# Patient Record
Sex: Male | Born: 1950 | Race: Black or African American | Hispanic: No | Marital: Married | Smoking: Never smoker
Health system: Southern US, Community
[De-identification: ages and names within clinical notes are randomized; demographics above are authoritative.]

## PROBLEM LIST (undated history)

## (undated) DIAGNOSIS — R399 Unspecified symptoms and signs involving the genitourinary system: Secondary | ICD-10-CM

## (undated) DIAGNOSIS — G629 Polyneuropathy, unspecified: Secondary | ICD-10-CM

## (undated) DIAGNOSIS — K219 Gastro-esophageal reflux disease without esophagitis: Secondary | ICD-10-CM

## (undated) DIAGNOSIS — Z973 Presence of spectacles and contact lenses: Secondary | ICD-10-CM

## (undated) DIAGNOSIS — Z860101 Personal history of adenomatous and serrated colon polyps: Secondary | ICD-10-CM

## (undated) DIAGNOSIS — Z86010 Personal history of colonic polyps: Secondary | ICD-10-CM

## (undated) DIAGNOSIS — R7303 Prediabetes: Secondary | ICD-10-CM

## (undated) DIAGNOSIS — M1990 Unspecified osteoarthritis, unspecified site: Secondary | ICD-10-CM

## (undated) HISTORY — PX: TONSILLECTOMY: SUR1361

## (undated) HISTORY — PX: COLONOSCOPY: SHX174

---

## 2003-05-08 ENCOUNTER — Ambulatory Visit (HOSPITAL_COMMUNITY): Admission: RE | Admit: 2003-05-08 | Discharge: 2003-05-08 | Payer: Self-pay | Admitting: Family Medicine

## 2003-08-03 ENCOUNTER — Ambulatory Visit (HOSPITAL_COMMUNITY): Admission: RE | Admit: 2003-08-03 | Discharge: 2003-08-03 | Payer: Self-pay | Admitting: Gastroenterology

## 2015-04-13 ENCOUNTER — Other Ambulatory Visit: Payer: Self-pay | Admitting: Gastroenterology

## 2015-04-13 HISTORY — PX: COLONOSCOPY: SHX174

## 2017-03-19 DIAGNOSIS — R040 Epistaxis: Secondary | ICD-10-CM | POA: Diagnosis not present

## 2018-01-08 DIAGNOSIS — Z125 Encounter for screening for malignant neoplasm of prostate: Secondary | ICD-10-CM | POA: Diagnosis not present

## 2018-01-08 DIAGNOSIS — E782 Mixed hyperlipidemia: Secondary | ICD-10-CM | POA: Diagnosis not present

## 2018-01-08 DIAGNOSIS — Z1159 Encounter for screening for other viral diseases: Secondary | ICD-10-CM | POA: Diagnosis not present

## 2018-01-08 DIAGNOSIS — R202 Paresthesia of skin: Secondary | ICD-10-CM | POA: Diagnosis not present

## 2018-01-08 DIAGNOSIS — Z0000 Encounter for general adult medical examination without abnormal findings: Secondary | ICD-10-CM | POA: Diagnosis not present

## 2018-07-04 DIAGNOSIS — R7303 Prediabetes: Secondary | ICD-10-CM | POA: Diagnosis not present

## 2018-07-04 DIAGNOSIS — G629 Polyneuropathy, unspecified: Secondary | ICD-10-CM | POA: Diagnosis not present

## 2018-07-09 ENCOUNTER — Emergency Department (HOSPITAL_BASED_OUTPATIENT_CLINIC_OR_DEPARTMENT_OTHER)
Admission: EM | Admit: 2018-07-09 | Discharge: 2018-07-09 | Disposition: A | Discharge status: Home / Self Care | Payer: BLUE CROSS/BLUE SHIELD | Attending: Emergency Medicine | Admitting: Emergency Medicine

## 2018-07-09 ENCOUNTER — Encounter (HOSPITAL_BASED_OUTPATIENT_CLINIC_OR_DEPARTMENT_OTHER): Payer: Self-pay | Admitting: Emergency Medicine

## 2018-07-09 ENCOUNTER — Other Ambulatory Visit: Payer: Self-pay

## 2018-07-09 DIAGNOSIS — R040 Epistaxis: Secondary | ICD-10-CM | POA: Diagnosis not present

## 2018-07-09 DIAGNOSIS — R030 Elevated blood-pressure reading, without diagnosis of hypertension: Secondary | ICD-10-CM | POA: Diagnosis not present

## 2018-07-09 MED ORDER — SILVER NITRATE-POT NITRATE 75-25 % EX MISC
CUTANEOUS | Status: AC
  Filled 2018-07-09: qty 1

## 2018-07-09 MED ORDER — PHENYLEPHRINE HCL 0.5 % NA SOLN
1.0000 [drp] | Freq: Once | NASAL | Status: DC
  Filled 2018-07-09: qty 15

## 2018-07-09 MED ORDER — OXYMETAZOLINE HCL 0.05 % NA SOLN
1.0000 | Freq: Once | NASAL | Status: AC
  Administered 2018-07-09: 1 via NASAL
  Filled 2018-07-09: qty 30

## 2018-07-09 NOTE — ED Provider Notes (Signed)
   [name]: George Spurling, MD, FACEP  CSN: [id number] MRN: [id number] ARRIVAL: 07/09/18 at [place]: [id number]   CHIEF COMPLAINT  Epistaxis   HISTORY OF PRESENT ILLNESS  07/09/18 2:25 AM George Jacobs is a 68 y.o. male developed a nosebleed about 1 hour prior to arrival.  He is not on anticoagulation.  He was unable to control the bleeding before arrival with the application of a clamp.  Symptoms are moderate and the bleeding is from the right nostril.  He denies trauma to his nose.  He denies pain.  He has a history of periodic nosebleeds once or twice a year.   History reviewed. No pertinent past medical history.  History reviewed. No pertinent surgical history.  History reviewed. No pertinent family history.  Social History   Tobacco Use  . Smoking status: Never Smoker  . Smokeless tobacco: Never Used  Substance Use Topics  . Alcohol use: Never    Frequency: Never  . Drug use: Never    Prior to Admission medications   Not on File    Allergies Patient has no known allergies.   REVIEW OF SYSTEMS  Negative except as noted here or in the History of Present Illness.   PHYSICAL EXAMINATION  Initial Vital Signs Blood pressure (!) 171/97, pulse 67, temperature 97.9 F (36.6 C), temperature source Oral, resp. rate 18, height 6\' 3"  (1.905 m), weight 111.1 kg, SpO2 98 %.  Examination General: Well-developed, well-nourished male in no acute distress; appearance consistent with age of record HENT: normocephalic; atraumatic; clots and right naris with oozing of blood around clots Eyes: Normal appearance Neck: supple Heart: regular rate and rhythm Lungs: clear to auscultation bilaterally Abdomen: soft; nondistended; nontender; bowel sounds present Extremities: No deformity; full range of motion; pulses normal Neurologic: Awake, alert and oriented; motor function intact in all extremities and symmetric; no facial droop Skin: Warm and  dry Psychiatric: Normal mood and affect   RESULTS  Summary of this visit's results, reviewed by myself:   EKG Interpretation  Date/Time:    Ventricular Rate:    PR Interval:    QRS Duration:   QT Interval:    QTC Calculation:   R Axis:     Text Interpretation:        Laboratory Studies: No results found for this or any previous visit (from the past 24 hour(s)). Imaging Studies: No results found.  ED COURSE and MDM  Nursing notes and initial vitals signs, including pulse oximetry, reviewed.  Vitals:   07/09/18 0030 07/09/18 0032 07/09/18 0224  BP:  (!) 185/99 (!) 171/97  Pulse:  88 67  Resp:  18 18  Temp:  97.9 F (36.6 C)   TempSrc:  Oral   SpO2:  98% 98%  Weight: 111.1 kg    Height: 6\' 3"  (1.905 m)     2:38 AM Clots removed from right naris using suction and forceps.  Afrin instilled in right naris.   3:06 AM Patient remains hemostatic.  We will send patient home with Afrin.  He was advised to return if symptoms worsen and we will consider packing him.  PROCEDURES    ED DIAGNOSES     ICD-10-CM   1. Right-sided epistaxis R04.0        Alanni Vader, Jenny Reichmann, MD 07/09/18 [id number]

## 2018-07-09 NOTE — ED Triage Notes (Signed)
Pt states he started having nose bleed for one hour, unable to control de bleeding, pt denies taking any blood thinners.

## 2018-11-22 DIAGNOSIS — R5383 Other fatigue: Secondary | ICD-10-CM | POA: Diagnosis not present

## 2018-11-22 DIAGNOSIS — G629 Polyneuropathy, unspecified: Secondary | ICD-10-CM | POA: Diagnosis not present

## 2018-11-22 DIAGNOSIS — R7303 Prediabetes: Secondary | ICD-10-CM | POA: Diagnosis not present

## 2018-11-22 DIAGNOSIS — E782 Mixed hyperlipidemia: Secondary | ICD-10-CM | POA: Diagnosis not present

## 2018-11-25 DIAGNOSIS — R5383 Other fatigue: Secondary | ICD-10-CM | POA: Diagnosis not present

## 2018-11-25 DIAGNOSIS — R7303 Prediabetes: Secondary | ICD-10-CM | POA: Diagnosis not present

## 2018-11-25 DIAGNOSIS — E782 Mixed hyperlipidemia: Secondary | ICD-10-CM | POA: Diagnosis not present

## 2019-01-16 DIAGNOSIS — Z0000 Encounter for general adult medical examination without abnormal findings: Secondary | ICD-10-CM | POA: Diagnosis not present

## 2019-02-11 DIAGNOSIS — S39012A Strain of muscle, fascia and tendon of lower back, initial encounter: Secondary | ICD-10-CM | POA: Insufficient documentation

## 2019-02-26 DIAGNOSIS — M545 Low back pain, unspecified: Secondary | ICD-10-CM | POA: Insufficient documentation

## 2019-08-11 ENCOUNTER — Other Ambulatory Visit: Payer: Self-pay | Admitting: Orthopedic Surgery

## 2019-08-11 DIAGNOSIS — M259 Joint disorder, unspecified: Secondary | ICD-10-CM

## 2019-08-22 ENCOUNTER — Other Ambulatory Visit: Payer: Self-pay

## 2019-08-22 ENCOUNTER — Ambulatory Visit
Admission: RE | Admit: 2019-08-22 | Discharge: 2019-08-22 | Disposition: A | Discharge status: Home / Self Care | Payer: Medicare Other | Source: Ambulatory Visit | Attending: Orthopedic Surgery | Admitting: Orthopedic Surgery

## 2019-08-22 DIAGNOSIS — M259 Joint disorder, unspecified: Secondary | ICD-10-CM

## 2019-10-07 ENCOUNTER — Other Ambulatory Visit: Payer: Self-pay | Admitting: Family Medicine

## 2019-10-07 ENCOUNTER — Other Ambulatory Visit: Payer: Self-pay

## 2019-10-07 ENCOUNTER — Ambulatory Visit
Admission: RE | Admit: 2019-10-07 | Discharge: 2019-10-07 | Disposition: A | Discharge status: Home / Self Care | Payer: Medicare Other | Source: Ambulatory Visit | Attending: Family Medicine | Admitting: Family Medicine

## 2019-10-07 DIAGNOSIS — R52 Pain, unspecified: Secondary | ICD-10-CM

## 2021-04-27 ENCOUNTER — Other Ambulatory Visit: Payer: Self-pay | Admitting: Urology

## 2021-05-01 DIAGNOSIS — C679 Malignant neoplasm of bladder, unspecified: Secondary | ICD-10-CM

## 2021-05-01 HISTORY — DX: Malignant neoplasm of bladder, unspecified: C67.9

## 2021-05-10 ENCOUNTER — Other Ambulatory Visit: Payer: Self-pay

## 2021-05-10 ENCOUNTER — Encounter (HOSPITAL_BASED_OUTPATIENT_CLINIC_OR_DEPARTMENT_OTHER): Payer: Self-pay | Admitting: Urology

## 2021-05-10 NOTE — Progress Notes (Signed)
Spoke w/ via phone for pre-op interview---patient and spouse Lab needs dos----NA               Lab results------NA COVID test -----patient states asymptomatic no test needed Arrive at 1300 05/13/21 NPO after MN NO Solid Food.  Clear liquids from MN until 1200 Med rec completed Medications to take morning of surgery ---none; Hold all vitamins and supplements prior to sx-- Diabetic medication -----NA Patient instructed no nail polish to be worn day of surgery Patient instructed to bring photo id and insurance card day of surgery Patient aware to have Driver (ride ) / caregiver    for 24 hours after surgery  Patient Special Instructions ----- Pre-Op special Istructions ----- Patient verbalized understanding of instructions that were given at this phone interview. Patient denies shortness of breath, chest pain, fever, cough at this phone interview.

## 2021-05-13 ENCOUNTER — Encounter (HOSPITAL_BASED_OUTPATIENT_CLINIC_OR_DEPARTMENT_OTHER): Payer: Self-pay | Admitting: Urology

## 2021-05-13 ENCOUNTER — Ambulatory Visit (HOSPITAL_BASED_OUTPATIENT_CLINIC_OR_DEPARTMENT_OTHER): Payer: Medicare Other | Admitting: Anesthesiology

## 2021-05-13 ENCOUNTER — Ambulatory Visit (HOSPITAL_BASED_OUTPATIENT_CLINIC_OR_DEPARTMENT_OTHER)
Admission: RE | Admit: 2021-05-13 | Discharge: 2021-05-13 | Disposition: A | Discharge status: Home / Self Care | Payer: Medicare Other | Attending: Urology | Admitting: Urology

## 2021-05-13 ENCOUNTER — Encounter (HOSPITAL_BASED_OUTPATIENT_CLINIC_OR_DEPARTMENT_OTHER)
Admission: RE | Disposition: A | Discharge status: Home / Self Care | Payer: Self-pay | Source: Home / Self Care | Attending: Urology

## 2021-05-13 DIAGNOSIS — R350 Frequency of micturition: Secondary | ICD-10-CM | POA: Insufficient documentation

## 2021-05-13 DIAGNOSIS — R310 Gross hematuria: Secondary | ICD-10-CM | POA: Diagnosis not present

## 2021-05-13 DIAGNOSIS — C672 Malignant neoplasm of lateral wall of bladder: Secondary | ICD-10-CM | POA: Diagnosis not present

## 2021-05-13 DIAGNOSIS — D494 Neoplasm of unspecified behavior of bladder: Secondary | ICD-10-CM | POA: Diagnosis present

## 2021-05-13 DIAGNOSIS — N401 Enlarged prostate with lower urinary tract symptoms: Secondary | ICD-10-CM | POA: Insufficient documentation

## 2021-05-13 DIAGNOSIS — R3912 Poor urinary stream: Secondary | ICD-10-CM | POA: Insufficient documentation

## 2021-05-13 DIAGNOSIS — R351 Nocturia: Secondary | ICD-10-CM | POA: Insufficient documentation

## 2021-05-13 HISTORY — PX: TRANSURETHRAL RESECTION OF BLADDER TUMOR: SHX2575

## 2021-05-13 SURGERY — TURBT (TRANSURETHRAL RESECTION OF BLADDER TUMOR)
Anesthesia: General | Site: Bladder

## 2021-05-13 MED ORDER — SUGAMMADEX SODIUM 200 MG/2ML IV SOLN
INTRAVENOUS | Status: DC | PRN
  Administered 2021-05-13: 190 mg via INTRAVENOUS

## 2021-05-13 MED ORDER — PROPOFOL 10 MG/ML IV BOLUS
INTRAVENOUS | Status: AC
  Filled 2021-05-13: qty 20

## 2021-05-13 MED ORDER — LIDOCAINE 2% (20 MG/ML) 5 ML SYRINGE
INTRAMUSCULAR | Status: DC | PRN
  Administered 2021-05-13: 100 mg via INTRAVENOUS

## 2021-05-13 MED ORDER — AMISULPRIDE (ANTIEMETIC) 5 MG/2ML IV SOLN: 10.0000 mg | Freq: Once | INTRAVENOUS | Status: DC | PRN

## 2021-05-13 MED ORDER — ONDANSETRON HCL 4 MG/2ML IJ SOLN: 4.0000 mg | Freq: Once | INTRAMUSCULAR | Status: DC | PRN

## 2021-05-13 MED ORDER — ACETAMINOPHEN 500 MG PO TABS
1000.0000 mg | ORAL_TABLET | Freq: Once | ORAL | Status: AC
  Administered 2021-05-13: 1000 mg via ORAL

## 2021-05-13 MED ORDER — PHENYLEPHRINE 40 MCG/ML (10ML) SYRINGE FOR IV PUSH (FOR BLOOD PRESSURE SUPPORT)
PREFILLED_SYRINGE | INTRAVENOUS | Status: AC
  Filled 2021-05-13: qty 10

## 2021-05-13 MED ORDER — STERILE WATER FOR IRRIGATION IR SOLN
Status: DC | PRN
Start: 2021-05-13 — End: 2021-05-13
  Administered 2021-05-13: 500 mL

## 2021-05-13 MED ORDER — DOCUSATE SODIUM 100 MG PO CAPS: 100.0000 mg | ORAL_CAPSULE | Freq: Every day | ORAL | 0 refills | Status: DC | PRN

## 2021-05-13 MED ORDER — ONDANSETRON HCL 4 MG/2ML IJ SOLN
INTRAMUSCULAR | Status: AC
  Filled 2021-05-13: qty 2

## 2021-05-13 MED ORDER — HYDROMORPHONE HCL 1 MG/ML IJ SOLN: 0.2500 mg | INTRAMUSCULAR | Status: DC | PRN

## 2021-05-13 MED ORDER — PROPOFOL 10 MG/ML IV BOLUS
INTRAVENOUS | Status: DC | PRN
  Administered 2021-05-13: 200 mg via INTRAVENOUS

## 2021-05-13 MED ORDER — DEXAMETHASONE SODIUM PHOSPHATE 10 MG/ML IJ SOLN
INTRAMUSCULAR | Status: AC
  Filled 2021-05-13: qty 1

## 2021-05-13 MED ORDER — FENTANYL CITRATE (PF) 100 MCG/2ML IJ SOLN
INTRAMUSCULAR | Status: AC
  Filled 2021-05-13: qty 2

## 2021-05-13 MED ORDER — ACETAMINOPHEN 10 MG/ML IV SOLN: 1000.0000 mg | Freq: Once | INTRAVENOUS | Status: DC | PRN

## 2021-05-13 MED ORDER — CEFAZOLIN SODIUM-DEXTROSE 2-4 GM/100ML-% IV SOLN
INTRAVENOUS | Status: AC
  Filled 2021-05-13: qty 100

## 2021-05-13 MED ORDER — LIDOCAINE 2% (20 MG/ML) 5 ML SYRINGE
INTRAMUSCULAR | Status: AC
  Filled 2021-05-13: qty 5

## 2021-05-13 MED ORDER — SODIUM CHLORIDE 0.9 % IR SOLN
Status: DC | PRN
  Administered 2021-05-13: 6000 mL
  Administered 2021-05-13: 3000 mL
  Administered 2021-05-13: 6000 mL

## 2021-05-13 MED ORDER — OXYCODONE-ACETAMINOPHEN 5-325 MG PO TABS: 1.0000 | ORAL_TABLET | ORAL | 0 refills | Status: DC | PRN

## 2021-05-13 MED ORDER — CEFAZOLIN SODIUM-DEXTROSE 2-4 GM/100ML-% IV SOLN
2.0000 g | Freq: Once | INTRAVENOUS | Status: AC
  Administered 2021-05-13: 2 g via INTRAVENOUS

## 2021-05-13 MED ORDER — FENTANYL CITRATE (PF) 100 MCG/2ML IJ SOLN: 25.0000 ug | INTRAMUSCULAR | Status: DC | PRN

## 2021-05-13 MED ORDER — ROCURONIUM BROMIDE 10 MG/ML (PF) SYRINGE
PREFILLED_SYRINGE | INTRAVENOUS | Status: AC
  Filled 2021-05-13: qty 10

## 2021-05-13 MED ORDER — OXYCODONE HCL 5 MG/5ML PO SOLN: 5.0000 mg | Freq: Once | ORAL | Status: DC | PRN

## 2021-05-13 MED ORDER — GEMCITABINE CHEMO FOR BLADDER INSTILLATION 2000 MG
2000.0000 mg | Freq: Once | INTRAVENOUS | Status: AC
  Administered 2021-05-13: 2000 mg via INTRAVESICAL
  Filled 2021-05-13: qty 2000

## 2021-05-13 MED ORDER — ROCURONIUM BROMIDE 10 MG/ML (PF) SYRINGE
PREFILLED_SYRINGE | INTRAVENOUS | Status: DC | PRN
  Administered 2021-05-13: 10 mg via INTRAVENOUS
  Administered 2021-05-13: 50 mg via INTRAVENOUS

## 2021-05-13 MED ORDER — FENTANYL CITRATE (PF) 100 MCG/2ML IJ SOLN
INTRAMUSCULAR | Status: DC | PRN
  Administered 2021-05-13: 100 ug via INTRAVENOUS

## 2021-05-13 MED ORDER — ACETAMINOPHEN 500 MG PO TABS
ORAL_TABLET | ORAL | Status: AC
  Filled 2021-05-13: qty 2

## 2021-05-13 MED ORDER — PHENYLEPHRINE 40 MCG/ML (10ML) SYRINGE FOR IV PUSH (FOR BLOOD PRESSURE SUPPORT)
PREFILLED_SYRINGE | INTRAVENOUS | Status: DC | PRN
  Administered 2021-05-13: 80 ug via INTRAVENOUS
  Administered 2021-05-13 (×2): 120 ug via INTRAVENOUS

## 2021-05-13 MED ORDER — LACTATED RINGERS IV SOLN: INTRAVENOUS | Status: DC

## 2021-05-13 MED ORDER — OXYCODONE HCL 5 MG PO TABS: 5.0000 mg | ORAL_TABLET | Freq: Once | ORAL | Status: DC | PRN

## 2021-05-13 SURGICAL SUPPLY — 23 items
BAG DRAIN URO-CYSTO SKYTR STRL (DRAIN) ×2 IMPLANT
BAG DRN RND TRDRP ANRFLXCHMBR (UROLOGICAL SUPPLIES) ×1
BAG DRN UROCATH (DRAIN) ×1
BAG URINE DRAIN 2000ML AR STRL (UROLOGICAL SUPPLIES) ×1 IMPLANT
CATH COUDE FOLEY 2W 5CC 16FR (CATHETERS) ×1 IMPLANT
CATH FOLEY 2WAY SLVR  5CC 18FR (CATHETERS) ×1
CATH FOLEY 2WAY SLVR 5CC 18FR (CATHETERS) IMPLANT
CLOTH BEACON ORANGE TIMEOUT ST (SAFETY) ×2 IMPLANT
GLOVE SURG NEOP MICRO LF SZ6.5 (GLOVE) ×1 IMPLANT
GLOVE SURG UNDER POLY LF SZ6.5 (GLOVE) ×1 IMPLANT
GOWN STRL REUS W/ TWL LRG LVL3 (GOWN DISPOSABLE) IMPLANT
GOWN STRL REUS W/TWL LRG LVL3 (GOWN DISPOSABLE) ×4 IMPLANT
HOLDER FOLEY CATH W/STRAP (MISCELLANEOUS) ×1 IMPLANT
IV NS IRRIG 3000ML ARTHROMATIC (IV SOLUTION) ×5 IMPLANT
KIT TURNOVER CYSTO (KITS) ×2 IMPLANT
LOOP CUT BIPOLAR 24F LRG (ELECTROSURGICAL) ×1 IMPLANT
MANIFOLD NEPTUNE II (INSTRUMENTS) ×1 IMPLANT
PACK CYSTO (CUSTOM PROCEDURE TRAY) ×2 IMPLANT
SYR TOOMEY IRRIG 70ML (MISCELLANEOUS) ×2
SYRINGE TOOMEY IRRIG 70ML (MISCELLANEOUS) IMPLANT
TRAY DSU PREP LF (CUSTOM PROCEDURE TRAY) ×1 IMPLANT
TUBE CONNECTING 12X1/4 (SUCTIONS) ×1 IMPLANT
WATER STERILE IRR 500ML POUR (IV SOLUTION) ×1 IMPLANT

## 2021-05-13 NOTE — H&P (Signed)
Office Visit Report     05/09/2021    CC/HPI: George Jacobs is a 71 year old male seen today for recently diagnosed bladder mass, BPH/LUTS impression cancer screening.   1. Bladder mass:  -Patient reports a 3-4-week history of painless gross hematuria in 03/2021. CT A/P 04/2019 demonstrated a 2 x 1.7 cm posterior bladder mass. No evidence of metastatic disease.  - Cystoscopy 05/09/2021 confirmed this lesion and no other lesion.  -He denies prior episodes of gross hematuria. He denies a smoking history. He denies a family history of urologic malignancy. He denies a history of urolithiasis or recurrent urinary tract infections.   #2. BPH/LUTS: He does have mild lower urinary tract symptoms. His IPSS score is 6, quality-of-life 3. He does complain of 4 times nocturia and a fair flow stream. He is relatively averse to starting medication and is not interested in tamsulosin. He was recently told to consider saw palmetto however he has not tried this medication yet.   #3. Prostate cancer screening: PSA on 09/29/2020 was 0.96. He denies a history of an elevated PSA. He denies a history of a prior TRUS biopsy. He denies taking 5 alpha reductase inhibitors. He states that several of his uncles have a diagnosis of prostate cancer.   Patient currently denies fever, chills, sweats, nausea, vomiting, abdominal or flank pain, or dysuria.   He has no significant past medical history. He denies taking anticoagulation.   He is retired from [employer].     ALLERGIES: No Known Drug Allergies    MEDICATIONS: Calcium  Multivitamin  Vitamin C  Vitamin D2  Zinc     GU PSH: Locm 300-399Mg /Ml Iodine,1Ml - 04/19/2021     NON-GU PSH: Tonsillectomy - 1965     GU PMH: Gross hematuria - 04/19/2021, - 04/07/2021 BPH w/LUTS - 04/07/2021 Encounter for Prostate Cancer screening - 04/07/2021 Nocturia - 04/07/2021 Urinary Frequency - 04/07/2021    NON-GU PMH: GERD    FAMILY HISTORY: 1 Daughter - Runs in  Family 2 sons - Runs in Family Prostate Cancer - Runs in Family   SOCIAL HISTORY: Marital Status: Married Current Smoking Status: Patient has never smoked.   Tobacco Use Assessment Completed: Used Tobacco in last 30 days? Does drink.  Drinks 2 caffeinated drinks per day.    REVIEW OF SYSTEMS:    GU Review Male:   Patient denies frequent urination, hard to postpone urination, burning/ pain with urination, get up at night to urinate, leakage of urine, stream starts and stops, trouble starting your stream, have to strain to urinate , erection problems, and penile pain.  Gastrointestinal (Upper):   Patient denies nausea, vomiting, and indigestion/ heartburn.  Gastrointestinal (Lower):   Patient denies diarrhea and constipation.  Constitutional:   Patient denies night sweats, fatigue, fever, and weight loss.  Skin:   Patient denies skin rash/ lesion and itching.  Eyes:   Patient denies blurred vision and double vision.  Ears/ Nose/ Throat:   Patient denies sore throat and sinus problems.  Hematologic/Lymphatic:   Patient denies swollen glands and easy bruising.  Cardiovascular:   Patient denies leg swelling and chest pains.  Respiratory:   Patient denies cough and shortness of breath.  Endocrine:   Patient denies excessive thirst.  Musculoskeletal:   Patient denies back pain and joint pain.  Neurological:   Patient denies headaches and dizziness.  Psychologic:   Patient denies depression and anxiety.   VITAL SIGNS: None   MULTI-SYSTEM PHYSICAL EXAMINATION:    Constitutional: Well-nourished. No  physical deformities. Normally developed. Good grooming.  Respiratory: No labored breathing, no use of accessory muscles.   Cardiovascular: Normal temperature, normal extremity pulses, no swelling, no varicosities.  Gastrointestinal: No mass, no tenderness, no rigidity, non obese abdomen.     Complexity of Data:  Source Of History:  Patient, Medical Record Summary  Lab Test Review:   PSA   Records Review:   AUA Symptom Score, Previous Doctor Records  Urine Test Review:   Urinalysis  X-Ray Review: C.T. Abdomen/Pelvis: Reviewed Films. Reviewed Report. Discussed With Patient.    Notes:                     CLINICAL DATA: Gross hematuria   EXAM:  CT ABDOMEN AND PELVIS WITHOUT AND WITH CONTRAST   TECHNIQUE:  Multidetector CT imaging of the abdomen and pelvis was performed  following the standard protocol before and following the bolus  administration of intravenous contrast.   CONTRAST: 125 mL Omnipaque 300 iodinated contrast IV   COMPARISON: None.   FINDINGS:  Lower chest: No acute abnormality.   Hepatobiliary: No solid liver abnormality is seen. Incidental,  benign hemangioma of the left lobe of the liver measuring 2.0 x 1.5  cm (series 5, image 18). No gallstones, gallbladder wall thickening,  or biliary dilatation.   Pancreas: Unremarkable. No pancreatic ductal dilatation or  surrounding inflammatory changes.   Spleen: Normal in size without significant abnormality.   Adrenals/Urinary Tract: Adrenal glands are unremarkable. Benign  right renal cyst. Kidneys are otherwise normal, without renal  calculi, solid lesion, or hydronephrosis. There is a contrast  enhancing, fronded intraluminal mass of the posterior right aspect  of the urinary bladder, measuring 2.0 x 1.7 cm (series 5, image 78,  series 12, image 34).   Stomach/Bowel: Stomach is within normal limits. Appendix not clearly  visualized. No evidence of bowel wall thickening, distention, or  inflammatory changes. Sigmoid diverticula.   Vascular/Lymphatic: Aortic atherosclerosis. No enlarged abdominal or  pelvic lymph nodes.   Reproductive: No mass or other significant abnormality.   Other: No abdominal wall hernia or abnormality. No abdominopelvic  ascites.   Musculoskeletal: No acute or significant osseous findings.   IMPRESSION:  1. There is a contrast enhancing, fronded intraluminal mass of  the  posterior right aspect of the urinary bladder, measuring 2.0 x 1.7  cm, consistent with primary bladder malignancy.  2. No evidence of lymphadenopathy or metastatic disease in the  abdomen or pelvis.   These results will be called to the ordering clinician or  representative by the Radiologist Assistant, and communication  documented in the PACS or [employer].   Aortic Atherosclerosis (ICD10-I70.0).    Electronically Signed  By: Delanna Ahmadi M.D.  On: 04/20/2021 11:01     PROCEDURES:         Flexible Cystoscopy - 52000  Risks, benefits, and some of the potential complications of the procedure were discussed at length with the patient including infection, bleeding, voiding discomfort, urinary retention, fever, chills, sepsis, and others. All questions were answered. Informed consent was obtained. Antibiotic prophylaxis was given. Sterile technique and intraurethral analgesia were used.  Meatus:  Normal size. Normal location. Normal condition.  Urethra:  No strictures.  External Sphincter:  Normal.  Verumontanum:  Normal.  Prostate:  Borderline obstructing. Moderate hyperplasia.  Bladder Neck:  Non-obstructing.  Ureteral Orifices:  Normal location. Normal size. Normal shape. Effluxed clear urine.  Bladder:  No trabeculation. 2 cm papillary bladder mass on the posterior  aspect of the bladder      The lower urinary tract was carefully examined. The procedure was well-tolerated and without complications. Antibiotic instructions were given. Instructions were given to call the office immediately for bloody urine, difficulty urinating, urinary retention, painful or frequent urination, fever, chills, nausea, vomiting or other illness. The patient stated that he understood these instructions and would comply with them.         Urinalysis w/Scope Dipstick Dipstick Cont'd Micro  Color: Yellow Bilirubin: Neg mg/dL WBC/hpf: 0 - 5/hpf  Appearance: Cloudy Ketones: Neg mg/dL RBC/hpf:  >60/hpf  Specific Gravity: 1.025 Blood: 3+ ery/uL Bacteria: Few (10-25/hpf)  pH: <=5.0 Protein: Trace mg/dL Cystals: NS (Not Seen)  Glucose: Neg mg/dL Urobilinogen: 0.2 mg/dL Casts: NS (Not Seen)    Nitrites: Neg Trichomonas: Not Present    Leukocyte Esterase: Neg leu/uL Mucous: Present      Epithelial Cells: 0 - 5/hpf      Yeast: NS (Not Seen)      Sperm: Not Present    ASSESSMENT:      ICD-10 Details  1 GU:   BPH w/LUTS - N40.1   2   Encounter for Prostate Cancer screening - Z12.5   3   Bladder, 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 Bladder mass:  -Recommend cystoscopy, TURBT, possible instillation of gemcitabine. Discussed risk and benefits. This been arranged for later this week.  -I gave copy of his CT report today with no evidence of metastatic lesion.   #2. BPH/LUTS: IPSS 6. We discussed options including tamsulosin or another alpha-blocker. He is relatively averse to starting medication. He has previously been told about saw palmetto. He elected to consider this option.   3. Prostate cancer screening: Last PSA in 09/2020 was 0.96. DRE 45 g, no nodules. I recommend annual PSA evaluation.   CC: Minette Brine, MD        Next Appointment:      Next Appointment: 05/13/2021 03:00 PM    Appointment Type: Surgery     Location: [facility] [phone]    Provider: Rexene Jacobs, M.D.    Reason for Visit: NE/OP CYSTO , TURBT, POSS INSTILL GEM    Urology Preoperative H&P   Chief Complaint: Bladder mass  History of Present Illness: George Jacobs is a 71 y.o. male with bladder mass here for TURBT, possible instillation of gemcitabine. Denies fevers, chills, dysuria.  History reviewed. No pertinent past medical history.  Past Surgical History:  Procedure Laterality Date   COLONOSCOPY     TONSILLECTOMY      Allergies:  No Known Allergies  History reviewed. No pertinent family history.  Social History:  reports that he has never smoked. He has never used smokeless tobacco. He reports that he does not drink alcohol and does not use drugs.  ROS: A complete review of systems was performed.  All systems are negative except for pertinent findings as noted.  Physical Exam:  Vital signs in last 24 hours:   Constitutional:  Alert and oriented, No acute distress Cardiovascular: Regular rate and rhythm Respiratory: Normal respiratory effort, Lungs clear bilaterally GI: Abdomen is soft, nontender, nondistended, no abdominal masses GU: No CVA tenderness Lymphatic: No lymphadenopathy Neurologic: Grossly intact, no focal deficits Psychiatric:  Normal mood and affect  Laboratory Data:  No results for input(s): WBC, HGB, HCT, PLT in the last 72 hours.  No results for input(s): NA, K, CL, GLUCOSE, BUN, CALCIUM, CREATININE in the last 72 hours.  Invalid input(s): CO3   No results found for this or any previous visit (from the past 24 hour(s)). No results found for this or any previous visit (from the past 240 hour(s)).  Renal Function: No results for input(s): CREATININE in the last 168 hours. CrCl cannot be calculated (No successful lab value found.).  Radiologic Imaging: No results found.  I independently reviewed the above imaging studies.  Assessment and Plan George Jacobs is a 71 y.o. male with bladder mass here for TURBT, possible instillation of gemcitabine. Ok to proceed.  George R. Jaber Dunlow MD 05/13/2021, 9:42 AM  [facility] Pager: [phone]

## 2021-05-13 NOTE — Op Note (Signed)
Operative Note  Preoperative diagnosis:  1.  Bladder mass  Postoperative diagnosis: 1.  Bladder mass  Procedure(s): 1.  TURBT medium 2. Instillation of gemcitabine  Surgeon: Rexene Alberts, MD  Assistants:  None  Anesthesia:  General  Complications:  None  EBL:  85ml  Specimens: 1.  ID Type Source Tests Collected by Time Destination  1 : right lasteral wall bladder mass Tissue PATH GU biopsy SURGICAL PATHOLOGY [name], MD 05/13/2021 1635    Drains/Catheters: 1.  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 male with a history of gross hematuria. CT A/P 03/2021 demonstrated a 2x1.7cm posterior bladder mass. Cystoscopy confirmed this lesion. All the risks, benefits were discussed with the patient to include but not limited to infection, pain, bleeding, damage to adjacent structures, need for further operations, adverse reaction to anesthesia and death.  Patient understands these risks and agrees to proceed with the operation as planned.    Description of procedure: After informed consent was obtained from the patient, the patient was taken to the operating room. General anesthesia was administered. The patient was placed in dorsal lithotomy position and prepped and draped in usual sterile fashion. Sequential compression devices were applied to lower extremities at the beginning of the case for DVT  prophylaxis. Antibiotics were infused prior to surgery start time. A surgical time-out was performed to properly identify the patient, the surgery to be performed, and the surgical site.  I dilated his fossa navicularis from 22-28 Fr.   We then passed the 21-French rigid cystoscope down the urethra and into the bladder under direct vision without any difficulty. The anterior urethral was normal. The prostate was non-obstructing. The bladder was inspected with 30 and 70 degree lenses. Once in the bladder, systematic evaluation of bladder revealed a 2.5cm right lateral bladder wall tumor. The ureteral orfices were in orthotopic position and not involved.   We then removed the cystoscope and then passed down the 26 French resectoscope sheath down the urethra into the bladder under direct vision with the visual obturator. The tumor was resected down to muscle. The TUR bladder tumor chips were retrieved from the bladder and each region of resection was passed off the field as a separate specimen.  Hemostasis was achieved using electrocautery. We then proceeded with removing the resectoscope and then placed in a Foley catheter. The patient tolerated the procedure well with no complication and was awoken from anesthesia and taken to recovery in stable condition.  While in the post-operative care unit, as a separate procedure, 2000 mg of gemcitabine in 50 mL of water was instilled in the bladder through the catheter and the catheter was plugged. This will remain indwelling for approximately one hour. It will then be drained from the bladder and the catheter will be removed and the patient discharged home.  Plan:  F/u in 1 week to review pathology.  [name]  Pager: [phone]

## 2021-05-13 NOTE — Transfer of Care (Signed)
Immediate Anesthesia Transfer of Care Note  Patient: George Jacobs  Procedure(s) Performed: TRANSURETHRAL RESECTION OF BLADDER TUMOR (TURBT) WITH CYSTOSCOPY/ POSSIBLE INSTILLATION OF GEMCITABINE POST OPERATIVE (Bladder)  Patient Location: PACU  Anesthesia Type:General   Level of Consciousness: drowsy  Airway & Oxygen Therapy: Patient Spontanous Breathing and Patient connected to nasal cannula oxygen  Post-op Assessment: Report given to RN  Post vital signs: Reviewed and stable  Last Vitals:  Vitals Value Taken Time  BP 143/100 05/13/21 1713  Temp    Pulse 67 05/13/21 1715  Resp 19 05/13/21 1715  SpO2 100 % 05/13/21 1715  Vitals shown include unvalidated device data.  Last Pain:  Vitals:   05/13/21 1325  TempSrc: Oral  PainSc: 0-No pain      Patients Stated Pain Goal: 4 (09/32/35 5732)  Complications: No notable events documented.

## 2021-05-13 NOTE — Discharge Instructions (Addendum)
Activity:  You are encouraged to ambulate frequently (about every hour during waking hours) to help prevent blood clots from forming in your legs or lungs.    Diet: You should advance your diet as instructed by your physician.  It will be normal to have some bloating, nausea, and abdominal discomfort intermittently.  Prescriptions:  You will be provided a prescription for pain medication to take as needed.  If your pain is not severe enough to require the prescription pain medication, you may take extra strength Tylenol instead which will have less side effects.  You should also take a prescribed stool softener to avoid straining with bowel movements as the prescription pain medication may constipate you.  What to call us about: You should call the office [phone]) if you develop fever > 101 or develop persistent vomiting. Activity:  You are encouraged to ambulate frequently (about every hour during waking hours) to help prevent blood clots from forming in your legs or lungs.                                               Transurethral Procedure  Medications: Resume all your other meds from home  Activity: 1. No heavy lifting > 10 pounds for 2 weeks. 2. No sexual activity for 2 weeks. 3. No strenuous activity for 2 weeks. 4. No driving while on narcotic pain medications. 5. Drink plenty of water. 6. Continue to walk at home - you can still get blood clots when you are at home so keep     active but don't over do it. 7. Your urine may have some blood in it - make sure you drink plenty of water. Call or           come to the ER immediately if you catheter stops draining or you are unable to urinate.  Bathing: You can shower. You can take a bath unless you have a foley catheter in place.  Signs / Symptoms to call: 1. Call if you have a fever greater than 101.5  2. Uncontrolled nausea / vomiting, uncontrolled pain / dizziness, unable to urinate, leg         swelling / leg pain, or any  other concerns.   Avoid spicy foods    You can reach [place] at [phone]

## 2021-05-13 NOTE — Anesthesia Preprocedure Evaluation (Addendum)
Anesthesia Evaluation  Patient identified by MRN, date of birth, ID band Patient awake    Reviewed: Allergy & Precautions, NPO status , Patient's Chart, lab work & pertinent test results  Airway Mallampati: I  TM Distance: >3 FB Neck ROM: Full    Dental no notable dental hx. (+) Teeth Intact, Dental Advisory Given   Pulmonary neg pulmonary ROS,    Pulmonary exam normal breath sounds clear to auscultation       Cardiovascular Exercise Tolerance: Good Normal cardiovascular exam Rhythm:Regular Rate:Normal     Neuro/Psych negative neurological ROS  negative psych ROS   GI/Hepatic Neg liver ROS,   Endo/Other    Renal/GU    Bladder mass    Musculoskeletal  (+) Arthritis ,   Abdominal   Peds  Hematology   Anesthesia Other Findings nka  Reproductive/Obstetrics                            Anesthesia Physical Anesthesia Plan  ASA: 1  Anesthesia Plan: General   Post-op Pain Management: Dilaudid IV   Induction: Intravenous  PONV Risk Score and Plan: 3 and Treatment may vary due to age or medical condition, Midazolam, Ondansetron and Dexamethasone  Airway Management Planned: Oral ETT  Additional Equipment: None  Intra-op Plan:   Post-operative Plan: Extubation in OR  Informed Consent: I have reviewed the patients History and Physical, chart, labs and discussed the procedure including the risks, benefits and alternatives for the proposed anesthesia with the patient or authorized representative who has indicated his/her understanding and acceptance.     Dental advisory given  Plan Discussed with: CRNA and Anesthesiologist  Anesthesia Plan Comments:      Anesthesia Quick Evaluation

## 2021-05-13 NOTE — Anesthesia Procedure Notes (Signed)
Procedure Name: Intubation Date/Time: 05/13/2021 4:09 PM Performed by: Bonney Aid, CRNA Pre-anesthesia Checklist: Patient identified, Emergency Drugs available, Suction available and Patient being monitored Patient Re-evaluated:Patient Re-evaluated prior to induction Oxygen Delivery Method: Circle system utilized Preoxygenation: Pre-oxygenation with 100% oxygen Induction Type: IV induction Ventilation: Mask ventilation without difficulty Laryngoscope Size: Mac and 4 Grade View: Grade I Tube type: Oral Tube size: 8.0 mm Number of attempts: 1 Airway Equipment and Method: Stylet Placement Confirmation: ETT inserted through vocal cords under direct vision, positive ETCO2 and breath sounds checked- equal and bilateral Secured at: 24 cm Tube secured with: Tape Dental Injury: Teeth and Oropharynx as per pre-operative assessment

## 2021-05-13 NOTE — Progress Notes (Signed)
Catheter clamped after Gemcitabine placed by Dr Abner Greenspan.

## 2021-05-13 NOTE — Anesthesia Postprocedure Evaluation (Signed)
Anesthesia Post Note  Patient: George Jacobs  Procedure(s) Performed: TRANSURETHRAL RESECTION OF BLADDER TUMOR (TURBT) WITH CYSTOSCOPY/ POSSIBLE INSTILLATION OF GEMCITABINE POST OPERATIVE (Bladder)     Patient location during evaluation: PACU Anesthesia Type: General Level of consciousness: awake and alert and oriented Pain management: pain level controlled Vital Signs Assessment: post-procedure vital signs reviewed and stable Respiratory status: spontaneous breathing, nonlabored ventilation and respiratory function stable Cardiovascular status: blood pressure returned to baseline and stable Postop Assessment: no apparent nausea or vomiting Anesthetic complications: no   No notable events documented.  Last Vitals:  Vitals:   05/13/21 1805 05/13/21 1815  BP:  (!) 154/92  Pulse: 71 68  Resp: 17 11  Temp:    SpO2: 97% 99%    Last Pain:  Vitals:   05/13/21 1815  TempSrc:   PainSc: 0-No pain                 Geisha Abernathy A.

## 2021-05-16 ENCOUNTER — Encounter (HOSPITAL_BASED_OUTPATIENT_CLINIC_OR_DEPARTMENT_OTHER): Payer: Self-pay | Admitting: Urology
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
## 2021-05-17 LAB — SURGICAL PATHOLOGY

## 2021-06-02 ENCOUNTER — Other Ambulatory Visit: Payer: Self-pay | Admitting: Urology

## 2021-06-03 ENCOUNTER — Other Ambulatory Visit: Payer: Self-pay

## 2021-06-03 ENCOUNTER — Encounter (HOSPITAL_BASED_OUTPATIENT_CLINIC_OR_DEPARTMENT_OTHER): Payer: Self-pay | Admitting: Urology

## 2021-06-03 MED ORDER — GEMCITABINE CHEMO FOR BLADDER INSTILLATION 2000 MG: 2000.0000 mg | Freq: Once | INTRAVENOUS | Status: AC

## 2021-06-03 NOTE — Progress Notes (Signed)
Spoke w/ via phone for pre-op interview--- pt Lab needs dos----   [employer] results------ n/a COVID test -----patient states asymptomatic no test needed Arrive at ------- 1200 on 06-06-2021 NPO after MN NO Solid Food.  Clear liquids from MN until--- 1100 Med rec completed Medications to take morning of surgery ----- none Diabetic medication ----- n/a Patient instructed no nail polish to be worn day of surgery Patient instructed to bring photo id and insurance card day of surgery Patient aware to have Driver (ride ) / caregiver  for 24 hours after surgery --wife, sylvia Patient Special Instructions ----- n/a Pre-Op special Istructions ----- n/a Patient verbalized understanding of instructions that were given at this phone interview. Patient denies shortness of breath, chest pain, fever, cough at this phone interview.

## 2021-06-06 ENCOUNTER — Ambulatory Visit (HOSPITAL_BASED_OUTPATIENT_CLINIC_OR_DEPARTMENT_OTHER): Payer: Medicare Other | Admitting: Anesthesiology

## 2021-06-06 ENCOUNTER — Encounter (HOSPITAL_BASED_OUTPATIENT_CLINIC_OR_DEPARTMENT_OTHER)
Admission: RE | Disposition: A | Discharge status: Home / Self Care | Payer: Self-pay | Source: Home / Self Care | Attending: Urology

## 2021-06-06 ENCOUNTER — Encounter (HOSPITAL_BASED_OUTPATIENT_CLINIC_OR_DEPARTMENT_OTHER): Payer: Self-pay | Admitting: Urology

## 2021-06-06 ENCOUNTER — Ambulatory Visit (HOSPITAL_BASED_OUTPATIENT_CLINIC_OR_DEPARTMENT_OTHER)
Admission: RE | Admit: 2021-06-06 | Discharge: 2021-06-06 | Disposition: A | Discharge status: Home / Self Care | Payer: Medicare Other | Attending: Urology | Admitting: Urology

## 2021-06-06 ENCOUNTER — Other Ambulatory Visit: Payer: Self-pay

## 2021-06-06 DIAGNOSIS — D494 Neoplasm of unspecified behavior of bladder: Secondary | ICD-10-CM | POA: Diagnosis present

## 2021-06-06 DIAGNOSIS — R351 Nocturia: Secondary | ICD-10-CM | POA: Diagnosis not present

## 2021-06-06 DIAGNOSIS — N401 Enlarged prostate with lower urinary tract symptoms: Secondary | ICD-10-CM | POA: Diagnosis not present

## 2021-06-06 DIAGNOSIS — R350 Frequency of micturition: Secondary | ICD-10-CM | POA: Diagnosis not present

## 2021-06-06 HISTORY — DX: Gastro-esophageal reflux disease without esophagitis: K21.9

## 2021-06-06 HISTORY — DX: Personal history of adenomatous and serrated colon polyps: Z86.0101

## 2021-06-06 HISTORY — PX: TRANSURETHRAL RESECTION OF BLADDER TUMOR: SHX2575

## 2021-06-06 HISTORY — DX: Unspecified osteoarthritis, unspecified site: M19.90

## 2021-06-06 HISTORY — DX: Polyneuropathy, unspecified: G62.9

## 2021-06-06 HISTORY — DX: Presence of spectacles and contact lenses: Z97.3

## 2021-06-06 HISTORY — DX: Unspecified symptoms and signs involving the genitourinary system: R39.9

## 2021-06-06 HISTORY — DX: Prediabetes: R73.03

## 2021-06-06 HISTORY — DX: Personal history of colonic polyps: Z86.010

## 2021-06-06 LAB — POCT I-STAT, CHEM 8
BUN: 17 mg/dL (ref 8–23)
Calcium, Ion: 1.18 mmol/L (ref 1.15–1.40)
Chloride: 100 mmol/L (ref 98–111)
Creatinine, Ser: 1.1 mg/dL (ref 0.61–1.24)
Glucose, Bld: 108 mg/dL — ABNORMAL HIGH (ref 70–99)
HCT: 45 % (ref 39.0–52.0)
Hemoglobin: 15.3 g/dL (ref 13.0–17.0)
Potassium: 4 mmol/L (ref 3.5–5.1)
Sodium: 140 mmol/L (ref 135–145)
TCO2: 30 mmol/L (ref 22–32)

## 2021-06-06 SURGERY — TURBT (TRANSURETHRAL RESECTION OF BLADDER TUMOR)
Anesthesia: General | Site: Bladder

## 2021-06-06 MED ORDER — FENTANYL CITRATE (PF) 100 MCG/2ML IJ SOLN
INTRAMUSCULAR | Status: AC
  Filled 2021-06-06: qty 2

## 2021-06-06 MED ORDER — ROCURONIUM BROMIDE 100 MG/10ML IV SOLN
INTRAVENOUS | Status: DC | PRN
Start: 2021-06-06 — End: 2021-06-06
  Administered 2021-06-06: 60 mg via INTRAVENOUS

## 2021-06-06 MED ORDER — ONDANSETRON HCL 4 MG/2ML IJ SOLN: 4.0000 mg | Freq: Once | INTRAMUSCULAR | Status: DC | PRN

## 2021-06-06 MED ORDER — ROCURONIUM BROMIDE 10 MG/ML (PF) SYRINGE
PREFILLED_SYRINGE | INTRAVENOUS | Status: AC
  Filled 2021-06-06: qty 10

## 2021-06-06 MED ORDER — ACETAMINOPHEN 500 MG PO TABS
ORAL_TABLET | ORAL | Status: AC
  Filled 2021-06-06: qty 2

## 2021-06-06 MED ORDER — DEXAMETHASONE SODIUM PHOSPHATE 10 MG/ML IJ SOLN
INTRAMUSCULAR | Status: AC
  Filled 2021-06-06: qty 1

## 2021-06-06 MED ORDER — DOCUSATE SODIUM 100 MG PO CAPS: 100.0000 mg | ORAL_CAPSULE | Freq: Every day | ORAL | 0 refills | Status: AC | PRN

## 2021-06-06 MED ORDER — FENTANYL CITRATE (PF) 100 MCG/2ML IJ SOLN
INTRAMUSCULAR | Status: DC | PRN
  Administered 2021-06-06: 100 ug via INTRAVENOUS

## 2021-06-06 MED ORDER — CEFAZOLIN SODIUM-DEXTROSE 2-4 GM/100ML-% IV SOLN
2.0000 g | Freq: Once | INTRAVENOUS | Status: AC
  Administered 2021-06-06: 2 g via INTRAVENOUS

## 2021-06-06 MED ORDER — LIDOCAINE HCL (PF) 2 % IJ SOLN
INTRAMUSCULAR | Status: AC
  Filled 2021-06-06: qty 5

## 2021-06-06 MED ORDER — DEXAMETHASONE SODIUM PHOSPHATE 4 MG/ML IJ SOLN
INTRAMUSCULAR | Status: DC | PRN
  Administered 2021-06-06: 5 mg via INTRAVENOUS

## 2021-06-06 MED ORDER — SODIUM CHLORIDE 0.9 % IR SOLN
Status: DC | PRN
  Administered 2021-06-06 (×3): 3000 mL via INTRAVESICAL

## 2021-06-06 MED ORDER — FENTANYL CITRATE (PF) 100 MCG/2ML IJ SOLN: 25.0000 ug | INTRAMUSCULAR | Status: DC | PRN

## 2021-06-06 MED ORDER — ONDANSETRON HCL 4 MG/2ML IJ SOLN
INTRAMUSCULAR | Status: AC
  Filled 2021-06-06: qty 2

## 2021-06-06 MED ORDER — CEFAZOLIN SODIUM-DEXTROSE 2-4 GM/100ML-% IV SOLN
INTRAVENOUS | Status: AC
  Filled 2021-06-06: qty 100

## 2021-06-06 MED ORDER — LACTATED RINGERS IV SOLN: INTRAVENOUS | Status: DC

## 2021-06-06 MED ORDER — LIDOCAINE HCL (CARDIAC) PF 100 MG/5ML IV SOSY
PREFILLED_SYRINGE | INTRAVENOUS | Status: DC | PRN
  Administered 2021-06-06: 100 mg via INTRAVENOUS

## 2021-06-06 MED ORDER — SUGAMMADEX SODIUM 200 MG/2ML IV SOLN
INTRAVENOUS | Status: DC | PRN
  Administered 2021-06-06: 200 mg via INTRAVENOUS

## 2021-06-06 MED ORDER — ONDANSETRON HCL 4 MG/2ML IJ SOLN
INTRAMUSCULAR | Status: DC | PRN
Start: 2021-06-06 — End: 2021-06-06
  Administered 2021-06-06: 4 mg via INTRAVENOUS

## 2021-06-06 MED ORDER — ACETAMINOPHEN 500 MG PO TABS
1000.0000 mg | ORAL_TABLET | Freq: Once | ORAL | Status: AC
  Administered 2021-06-06: 1000 mg via ORAL

## 2021-06-06 MED ORDER — PROPOFOL 10 MG/ML IV BOLUS
INTRAVENOUS | Status: DC | PRN
  Administered 2021-06-06: 150 mg via INTRAVENOUS

## 2021-06-06 MED ORDER — OXYCODONE-ACETAMINOPHEN 5-325 MG PO TABS: 1.0000 | ORAL_TABLET | ORAL | 0 refills | Status: DC | PRN

## 2021-06-06 MED ORDER — MIDAZOLAM HCL 2 MG/2ML IJ SOLN
INTRAMUSCULAR | Status: AC
  Filled 2021-06-06: qty 2

## 2021-06-06 SURGICAL SUPPLY — 31 items
BAG DRAIN URO-CYSTO SKYTR STRL (DRAIN) ×2 IMPLANT
BAG DRN RND TRDRP ANRFLXCHMBR (UROLOGICAL SUPPLIES) ×1
BAG DRN UROCATH (DRAIN) ×1
BAG URINE DRAIN 2000ML AR STRL (UROLOGICAL SUPPLIES) ×1 IMPLANT
BAG URINE LEG 500ML (DRAIN) IMPLANT
CATH COUDE FOLEY 2W 5CC 18FR (CATHETERS) IMPLANT
CATH FOLEY 2W COUNCIL 20FR 5CC (CATHETERS) ×1 IMPLANT
CATH FOLEY 2WAY SLVR  5CC 16FR (CATHETERS)
CATH FOLEY 2WAY SLVR  5CC 22FR (CATHETERS)
CATH FOLEY 2WAY SLVR 30CC 20FR (CATHETERS) IMPLANT
CATH FOLEY 2WAY SLVR 5CC 16FR (CATHETERS) IMPLANT
CATH FOLEY 2WAY SLVR 5CC 22FR (CATHETERS) IMPLANT
CATH SET URETHRAL DILATOR (CATHETERS) IMPLANT
CATH URET 5FR 28IN OPEN ENDED (CATHETERS) IMPLANT
CLOTH BEACON ORANGE TIMEOUT ST (SAFETY) ×2 IMPLANT
ELECT REM PT RETURN 9FT ADLT (ELECTROSURGICAL)
ELECTRODE REM PT RTRN 9FT ADLT (ELECTROSURGICAL) ×1 IMPLANT
EVACUATOR MICROVAS BLADDER (UROLOGICAL SUPPLIES) IMPLANT
GLOVE SURG ENC MOIS LTX SZ7 (GLOVE) IMPLANT
GOWN STRL REUS W/TWL LRG LVL3 (GOWN DISPOSABLE) ×2 IMPLANT
GUIDEWIRE STR DUAL SENSOR (WIRE) ×1 IMPLANT
GUIDEWIRE ZIPWRE .038 STRAIGHT (WIRE) IMPLANT
IV NS IRRIG 3000ML ARTHROMATIC (IV SOLUTION) ×3 IMPLANT
KIT TURNOVER CYSTO (KITS) ×2 IMPLANT
LOOP CUT BIPOLAR 24F LRG (ELECTROSURGICAL) ×1 IMPLANT
MANIFOLD NEPTUNE II (INSTRUMENTS) IMPLANT
PACK CYSTO (CUSTOM PROCEDURE TRAY) ×2 IMPLANT
SYR TOOMEY IRRIG 70ML (MISCELLANEOUS) ×2
SYRINGE TOOMEY IRRIG 70ML (MISCELLANEOUS) IMPLANT
TUBE CONNECTING 12X1/4 (SUCTIONS) IMPLANT
TUBING UROLOGY SET (TUBING) ×1 IMPLANT

## 2021-06-06 NOTE — Discharge Instructions (Addendum)
Activity:  You are encouraged to ambulate frequently (about every hour during waking hours) to help prevent blood clots from forming in your legs or lungs.    Diet: You should advance your diet as instructed by your physician.  It will be normal to have some bloating, nausea, and abdominal discomfort intermittently.  Prescriptions:  You will be provided a prescription for pain medication to take as needed.  If your pain is not severe enough to require the prescription pain medication, you may take extra strength Tylenol instead which will have less side effects.  You should also take a prescribed stool softener to avoid straining with bowel movements as the prescription pain medication may constipate you.  What to call us about: You should call the office [phone]) if you develop fever > 101 or develop persistent vomiting. Activity:  You are encouraged to ambulate frequently (about every hour during waking hours) to help prevent blood clots from forming in your legs or lungs.    You have a foley catheter in place. Remove foley with provided syringe on Thursday AM.    Post Anesthesia Home Care Instructions  Activity: Get plenty of rest for the remainder of the day. A responsible individual must stay with you for 24 hours following the procedure.  For the next 24 hours, DO NOT: -Drive a car -Paediatric nurse -Drink alcoholic beverages -Take any medication unless instructed by your physician -Make any legal decisions or sign important papers.  Meals: Start with liquid foods such as gelatin or soup. Progress to regular foods as tolerated. Avoid greasy, spicy, heavy foods. If nausea and/or vomiting occur, drink only clear liquids until the nausea and/or vomiting subsides. Call your physician if vomiting continues.  Special Instructions/Symptoms: Your throat may feel dry or sore from the anesthesia or the breathing tube placed in your throat during surgery. If this causes discomfort,  gargle with warm salt water. The discomfort should disappear within 24 hours.

## 2021-06-06 NOTE — Anesthesia Preprocedure Evaluation (Signed)
Anesthesia Evaluation  Patient identified by MRN, date of birth, ID band Patient awake    Reviewed: Allergy & Precautions, NPO status , Patient's Chart, lab work & pertinent test results  Airway Mallampati: II  TM Distance: >3 FB Neck ROM: Full    Dental  (+) Teeth Intact, Dental Advisory Given   Pulmonary neg pulmonary ROS,    Pulmonary exam normal breath sounds clear to auscultation       Cardiovascular negative cardio ROS Normal cardiovascular exam Rhythm:Regular Rate:Normal     Neuro/Psych  Neuromuscular disease    GI/Hepatic Neg liver ROS, GERD  ,  Endo/Other  negative endocrine ROS  Renal/GU negative Renal ROS   Bladder cancer     Musculoskeletal  (+) Arthritis , Osteoarthritis,    Abdominal   Peds  Hematology negative hematology ROS (+)   Anesthesia Other Findings Day of surgery medications reviewed with the patient.   Reproductive/Obstetrics                             Anesthesia Physical Anesthesia Plan  ASA: 2  Anesthesia Plan: General   Post-op Pain Management:    Induction: Intravenous  PONV Risk Score and Plan: 3 and Dexamethasone and Ondansetron  Airway Management Planned: Oral ETT and LMA  Additional Equipment:   Intra-op Plan:   Post-operative Plan: Extubation in OR  Informed Consent: I have reviewed the patients History and Physical, chart, labs and discussed the procedure including the risks, benefits and alternatives for the proposed anesthesia with the patient or authorized representative who has indicated his/her understanding and acceptance.     Dental advisory given  Plan Discussed with: CRNA  Anesthesia Plan Comments:         Anesthesia Quick Evaluation

## 2021-06-06 NOTE — Anesthesia Procedure Notes (Signed)
Procedure Name: Intubation Date/Time: 06/06/2021 12:35 PM Performed by: Bufford Spikes, CRNA Pre-anesthesia Checklist: Patient identified, Emergency Drugs available, Suction available and Patient being monitored Patient Re-evaluated:Patient Re-evaluated prior to induction Oxygen Delivery Method: Circle system utilized Preoxygenation: Pre-oxygenation with 100% oxygen Induction Type: IV induction Ventilation: Mask ventilation without difficulty Laryngoscope Size: Miller and 2 Grade View: Grade II Tube type: Oral Tube size: 7.5 mm Number of attempts: 1 Airway Equipment and Method: Stylet and Oral airway Placement Confirmation: ETT inserted through vocal cords under direct vision, positive ETCO2 and breath sounds checked- equal and bilateral Secured at: 22 cm Tube secured with: Tape Dental Injury: Teeth and Oropharynx as per pre-operative assessment

## 2021-06-06 NOTE — Op Note (Signed)
Operative Note  Preoperative diagnosis:  1.  Bladder cancer  Postoperative diagnosis: 1.  Bladder cancer  Procedure(s): 1.  TURBT small  Surgeon: George Alberts, MD  Assistants:  None  Anesthesia:  General  Complications:  None  EBL:  Minimal  Specimens: 1.  ID Type Source Tests Collected by Time Destination  1 : prior resection site Tissue PATH GU biopsy SURGICAL PATHOLOGY George Lima, MD 06/06/2021 1218   2 : prior resection site deep Tissue PATH GU biopsy SURGICAL PATHOLOGY George Lima, MD 06/06/2021 1259    Drains/Catheters: 1.  83 French council catheter  Intraoperative findings:   Right lateral wall prior resection site with overlying exudate. No papillary lesion seen. Resected down to level of muscle.  Number of tumors:        1 Size of largest tumor:      Prior resection site surface area about 4x4cm.  Characteristics of tumors:     Formerly papillary, now exudate   Primary   Yes  Suspicious for Carcinoma in situ:    No  Clinical tumor stage:        cTa  Bimanual exam under anesthesia:       Yes - no evidence of 3D mass  Visually complete resection:                 Yes  Visualization of detrusor muscle in resection base:        Yes  Visual evaluation for perforation:             Performed, no evidence of perforation.   Indication:  George Jacobs is a 71 y.o. male with a history of HG Ta UCB here for re-resection. All the risks, benefits were discussed with the patient to include but not limited to infection, pain, bleeding, damage to adjacent structures, need for further operations, adverse reaction to anesthesia and death.  Patient understands these risks and agrees to proceed with the operation as planned.    Description of procedure: After informed consent was obtained from the patient, the patient was taken to the operating room. General anesthesia was administered. The patient was placed in dorsal lithotomy position and prepped and draped in usual  sterile fashion. Sequential compression devices were applied to lower extremities at the beginning of the case for DVT prophylaxis. Antibiotics were infused prior to surgery start time. A surgical time-out was performed to properly identify the patient, the surgery to be performed, and the surgical site.     We then passed the 21-French rigid cystoscope down the urethra and into the bladder under direct vision without any difficulty. The anterior urethral was normal. The prostate was moderately obstructing with an elevated bladder neck. The bladder was inspected with 30 and 70 degree lenses. Once in the bladder, systematic evaluation of bladder revealed exudate overlying the prior resection site on the right lateral wall. There were no other tumors identified. The ureteral orfices were in orthotopic position and not involved.   We then removed the cystoscope and then passed down the 26 French resectoscope sheath down the urethra into the bladder under direct vision with the visual obturator. The tumor was resected down to muscle. The TUR bladder tumor chips were retrieved from the bladder and each region of resection was passed off the field as a separate specimen.  Hemostasis was achieved using electrocautery. We then proceeded with removing the resectoscope and then placed in a 20 Fr Foley catheter over a previously placed water.  The patient tolerated the procedure well with no complication and was awoken from anesthesia and taken to recovery in stable condition.    Plan:  He will remove foley in 3 days. He will followup next week to discuss next steps.  [facility]  Pager: [phone]

## 2021-06-06 NOTE — H&P (Signed)
Office Visit Report     05/23/2021   --------------------------------------------------------------------------------   George Jacobs  MRN: [id number]  DOB: [birth year], 71 year old Male  SSN:    PRIMARY CARE:    REFERRING:  [name], MD  PROVIDER:  Rexene Alberts, M.D.  LOCATION:  [facility] [phone]     --------------------------------------------------------------------------------   CC/HPI: George Jacobs is a 71 year old male seen today for HG UCB, BPH/LUTS and prostate cancer screening.   1. 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 CT A/P 03/2021 demonstrated a 2 x 1.7 cm posterior bladder mass. No evidence of metastatic disease.  - Cystoscopy 05/09/2021 confirmed this lesion and no other lesion.  -He denies prior episodes of gross hematuria. He denies a smoking history. He denies a family history of urologic malignancy. He denies a history of urolithiasis or recurrent urinary tract infections.  -S/p TURBT with gemcitabine 05/13/2021 (path: HG Ta UCB. Muscle not present)  -He recovered well from the surgery with no abdominal pain, flank pain. He had some mild dysuria that resolved. His gross hematuria has resolved.   #2. BPH/LUTS: He does have mild lower urinary tract symptoms. His IPSS score is 6, quality-of-life 3. He does complain of 4 times nocturia and a fair flow stream. He is relatively averse to starting medication and is not interested in tamsulosin. He was recently told to consider saw palmetto however he has not tried this medication yet.   #3. Prostate cancer screening: PSA on 09/29/2020 was 0.96. He denies a history of an elevated PSA. He denies a history of a prior TRUS biopsy. He denies taking 5 alpha reductase inhibitors. He states that several of his uncles have a diagnosis of prostate cancer.   Patient currently denies fever, chills, sweats, nausea,  vomiting, abdominal or flank pain, or dysuria.   He has no significant past medical history. He denies taking anticoagulation.   He is retired from [employer].     ALLERGIES: No Known Drug Allergies    MEDICATIONS: Calcium  Multivitamin  Vitamin C  Vitamin D2  Zinc     GU PSH: Bladder Instill AntiCA Agent - 05/13/2021 Cystoscopy - 05/09/2021 Cystoscopy TURBT 2-5 cm - 05/13/2021 Locm 300-399Mg /Ml Iodine,1Ml - 04/19/2021     NON-GU PSH: Tonsillectomy - 1965     GU PMH: Bladder, Neoplasm of Unspecified behavior - 05/09/2021 BPH w/LUTS - 05/09/2021, - 04/07/2021 Encounter for Prostate Cancer screening - 05/09/2021, - 04/07/2021 Urinary Frequency (Stable) - 05/09/2021, - 04/07/2021 Gross hematuria - 04/19/2021, - 04/07/2021 Nocturia - 04/07/2021    NON-GU PMH: GERD    FAMILY HISTORY: 1 Daughter - Runs in Family 2 sons - Runs in Family Prostate Cancer - Runs in Family   SOCIAL HISTORY: Marital Status: Married Current Smoking Status: Patient has never smoked.   Tobacco Use Assessment Completed: Used Tobacco in last 30 days? Does drink.  Drinks 2 caffeinated drinks per day.    REVIEW OF SYSTEMS:    GU Review Male:   Patient denies frequent urination, hard to postpone urination, burning/ pain with urination, get up at night to urinate, leakage of urine, stream starts and stops, trouble starting your stream, have to strain to urinate , erection problems, and penile pain.  Gastrointestinal (Upper):   Patient denies indigestion/ heartburn, nausea, and vomiting.  Gastrointestinal (Lower):   Patient denies diarrhea and constipation.  Constitutional:   Patient denies fever, night sweats, weight loss,  and fatigue.  Skin:   Patient denies skin rash/ lesion and itching.  Eyes:   Patient denies blurred vision and double vision.  Ears/ Nose/ Throat:   Patient denies sore throat and sinus problems.  Hematologic/Lymphatic:   Patient denies swollen glands and easy bruising.  Cardiovascular:    Patient denies leg swelling and chest pains.  Respiratory:   Patient denies cough and shortness of breath.  Endocrine:   Patient denies excessive thirst.  Musculoskeletal:   Patient denies back pain and joint pain.  Neurological:   Patient denies headaches and dizziness.  Psychologic:   Patient denies depression and anxiety.   VITAL SIGNS: None   MULTI-SYSTEM PHYSICAL EXAMINATION:    Constitutional: Well-nourished. No physical deformities. Normally developed. Good grooming.  Respiratory: No labored breathing, no use of accessory muscles.   Gastrointestinal: No mass, no tenderness, no rigidity, non obese abdomen.     Complexity of Data:  Source Of History:  Patient, Medical Record Summary  Records Review:   Previous Doctor Records  Urine Test Review:   Urinalysis  X-Ray Review: C.T. Abdomen/Pelvis: Reviewed Films. Reviewed Report. Discussed With Patient.     PROCEDURES:          Urinalysis w/Scope Dipstick Dipstick Cont'd Micro  Color: Yellow Bilirubin: Neg mg/dL WBC/hpf: 0 - 5/hpf  Appearance: Slightly Cloudy Ketones: Neg mg/dL RBC/hpf: 3 - 10/hpf  Specific Gravity: 1.025 Blood: 3+ ery/uL Bacteria: Rare (0-9/hpf)  pH: <=5.0 Protein: Trace mg/dL Cystals: NS (Not Seen)  Glucose: Neg mg/dL Urobilinogen: 0.2 mg/dL Casts: NS (Not Seen)    Nitrites: Neg Trichomonas: Not Present    Leukocyte Esterase: Neg leu/uL Mucous: Not Present      Epithelial Cells: NS (Not Seen)      Yeast: NS (Not Seen)      Sperm: Not Present    ASSESSMENT:      ICD-10 Details  1 GU:   Bladder, Neoplasm of Unspecified behavior - D49.4   2   BPH w/LUTS - N40.1   3   Encounter for Prostate Cancer screening - Z12.5   4   Urinary Frequency - R35.0    PLAN:           Document Letter(s):  Created for Patient: Clinical Summary         Notes:   #1. 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 We reviewed NCCN guidelines for recommendation of reresection TURBT.  -Following this, we will plan for BCG induction and BCG maintenance with 71-month cystoscopy surveillance. Discussed risk and benefits of TURBT.   #2. BPH/LUTS: IPSS 6. We discussed options including tamsulosin or another alpha-blocker. He is relatively averse to starting medication. He has previously been told about saw palmetto. He elected to consider this option.   3. Prostate cancer screening: Last PSA in 09/2020 was 0.96. DRE 45 g, no nodules. I recommend annual PSA evaluation.   CC: Minette Brine, MD   Urology Preoperative H&P   Chief Complaint: Bladder cancer  History of Present Illness: George Jacobs is a 70 y.o. male with bladder cancer here for re-resection TURBT. Denies fevers, chills, dysuria.    Past Medical History:  Diagnosis Date   Bladder cancer [facility]) 05/2021   urologist--- dr Sapna Padron   GERD (gastroesophageal reflux disease)    occasional, no meds   History of adenomatous polyp of colon    Lower urinary  tract symptoms (LUTS)    Neuropathy    OA (osteoarthritis)    hips   Pre-diabetes    Wears glasses     Past Surgical History:  Procedure Laterality Date   COLONOSCOPY  04/13/2015   TONSILLECTOMY     child   TRANSURETHRAL RESECTION OF BLADDER TUMOR N/A 05/13/2021   Procedure: TRANSURETHRAL RESECTION OF BLADDER TUMOR (TURBT) WITH CYSTOSCOPY/ POSSIBLE INSTILLATION OF GEMCITABINE POST OPERATIVE;  Surgeon: [name], MD;  Location: [facility];  Service: Urology;  Laterality: N/A;    Allergies: No Known Allergies  History reviewed. No pertinent family history.  Social History:  reports that he has never smoked. He has never used smokeless tobacco. He reports that he does not drink alcohol and does not use drugs.  ROS: A complete review of systems was performed.  All systems are negative except for pertinent findings as noted.  Physical Exam:  Vital signs in  last 24 hours: Temp:  [98.6 F (37 C)] 98.6 F (37 C) (02/06 1201) Pulse Rate:  [94] 94 (02/06 1201) Resp:  [17] 17 (02/06 1201) BP: (152)/(61) 152/61 (02/06 1201) SpO2:  [99 %] 99 % (02/06 1201) Weight:  [90.4 kg] 90.4 kg (02/06 1201) Constitutional:  Alert and oriented, No acute distress Cardiovascular: Regular rate and rhythm Respiratory: Normal respiratory effort, Lungs clear bilaterally GI: Abdomen is soft, nontender, nondistended, no abdominal masses GU: No CVA tenderness Lymphatic: No lymphadenopathy Neurologic: Grossly intact, no focal deficits Psychiatric: Normal mood and affect  Laboratory Data:  Recent Labs    06/06/21 1217  HGB 15.3  HCT 45.0    Recent Labs    06/06/21 1217  NA 140  K 4.0  CL 100  GLUCOSE 108*  BUN 17  CREATININE 1.10     Results for orders placed or performed during the hospital encounter of 06/06/21 (from the past 24 hour(s))  I-STAT, chem 8     Status: Abnormal   Collection Time: 06/06/21 12:17 PM  Result Value Ref Range   Sodium 140 135 - 145 mmol/L   Potassium 4.0 3.5 - 5.1 mmol/L   Chloride 100 98 - 111 mmol/L   BUN 17 8 - 23 mg/dL   Creatinine, Ser 1.10 0.61 - 1.24 mg/dL   Glucose, Bld 108 (H) 70 - 99 mg/dL   Calcium, Ion 1.18 1.15 - 1.40 mmol/L   TCO2 30 22 - 32 mmol/L   Hemoglobin 15.3 13.0 - 17.0 g/dL   HCT 45.0 39.0 - 52.0 %   No results found for this or any previous visit (from the past 240 hour(s)).  Renal Function: Recent Labs    06/06/21 1217  CREATININE 1.10   Estimated Creatinine Clearance: 75.8 mL/min (by C-G formula based on SCr of 1.1 mg/dL).  Radiologic Imaging: No results found.  I independently reviewed the above imaging studies.  Assessment and Plan Izaiah Tabb is a 71 y.o. male with bladder cancer here for re-resection TURBT.    [name] Devlyn Parish MD 06/06/2021, 12:28 PM  [facility] Pager: [phone]): [phone]

## 2021-06-06 NOTE — Transfer of Care (Signed)
Immediate Anesthesia Transfer of Care Note  Patient: George Jacobs  Procedure(s) Performed: TRANSURETHRAL RESECTION OF BLADDER TUMOR (TURBT)/ CYSTOSCOPY/ (Bladder)  Patient Location: PACU  Anesthesia Type:General  Level of Consciousness: awake, alert  and oriented  Airway & Oxygen Therapy: Patient Spontanous Breathing and Patient connected to nasal cannula oxygen  Post-op Assessment: Report given to RN and Post -op Vital signs reviewed and stable  Post vital signs: Reviewed and stable  Last Vitals:  Vitals Value Taken Time  BP 158/92 06/06/21 1332  Temp 36.5 C 06/06/21 1330  Pulse 64 06/06/21 1340  Resp 16 06/06/21 1340  SpO2 99 % 06/06/21 1340  Vitals shown include unvalidated device data.  Last Pain:  Vitals:   06/06/21 1330  TempSrc:   PainSc: Asleep         Complications: No notable events documented.

## 2021-06-07 ENCOUNTER — Encounter (HOSPITAL_BASED_OUTPATIENT_CLINIC_OR_DEPARTMENT_OTHER): Payer: Self-pay | Admitting: Urology

## 2021-06-07 LAB — SURGICAL PATHOLOGY

## 2021-06-07 NOTE — Anesthesia Postprocedure Evaluation (Signed)
Anesthesia Post Note  Patient: George Jacobs  Procedure(s) Performed: TRANSURETHRAL RESECTION OF BLADDER TUMOR (TURBT)/ CYSTOSCOPY/ (Bladder)     Patient location during evaluation: PACU Anesthesia Type: General Level of consciousness: awake and alert Pain management: pain level controlled Vital Signs Assessment: post-procedure vital signs reviewed and stable Respiratory status: spontaneous breathing, nonlabored ventilation, respiratory function stable and patient connected to nasal cannula oxygen Cardiovascular status: blood pressure returned to baseline and stable Postop Assessment: no apparent nausea or vomiting Anesthetic complications: no   No notable events documented.  Last Vitals:  Vitals:   06/06/21 1415 06/06/21 1525  BP: (!) 168/98 (!) 160/86  Pulse: (!) 57 (!) 57  Resp: 18 16  Temp:  36.4 C  SpO2: 99% 99%    Last Pain:  Vitals:   06/06/21 1555  TempSrc:   PainSc: [name]

## 2021-07-01 IMAGING — CR DG HIP (WITH OR WITHOUT PELVIS) 2-3V*R*
3 series · 3 of 3 positions shown · non-contrast
Comparison: None.

CLINICAL DATA: Pain

EXAM:
DG HIP (WITH OR WITHOUT PELVIS) 2-3V RIGHT

[w pelvis upright]
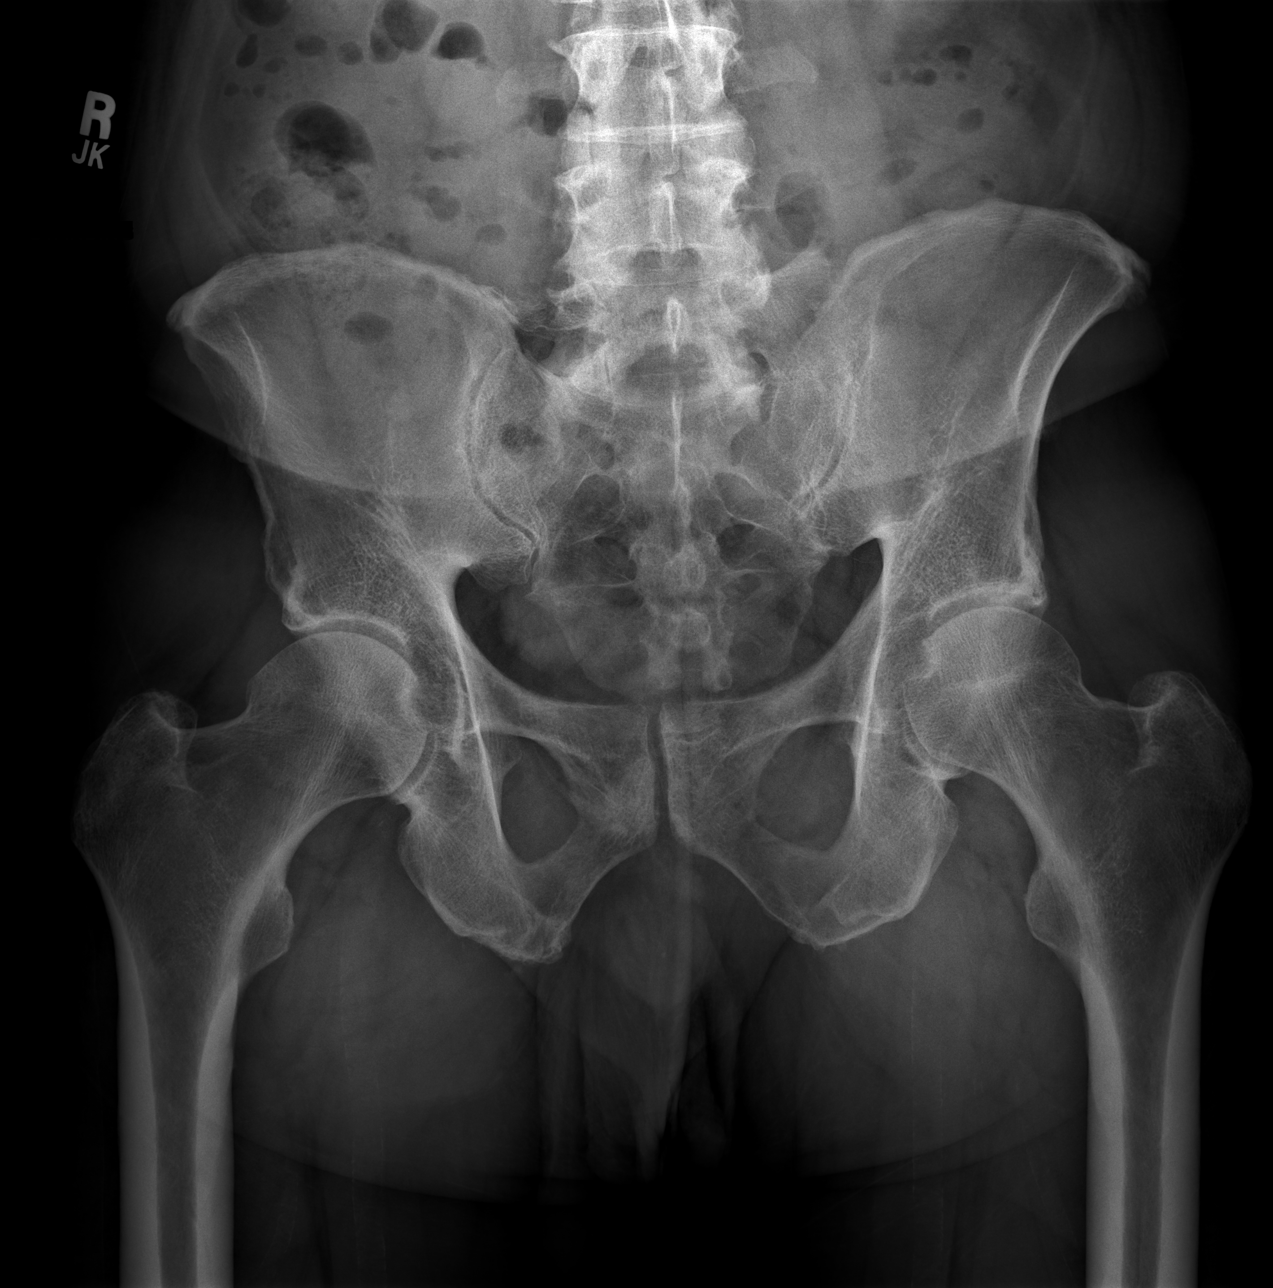

[w hip ap right]
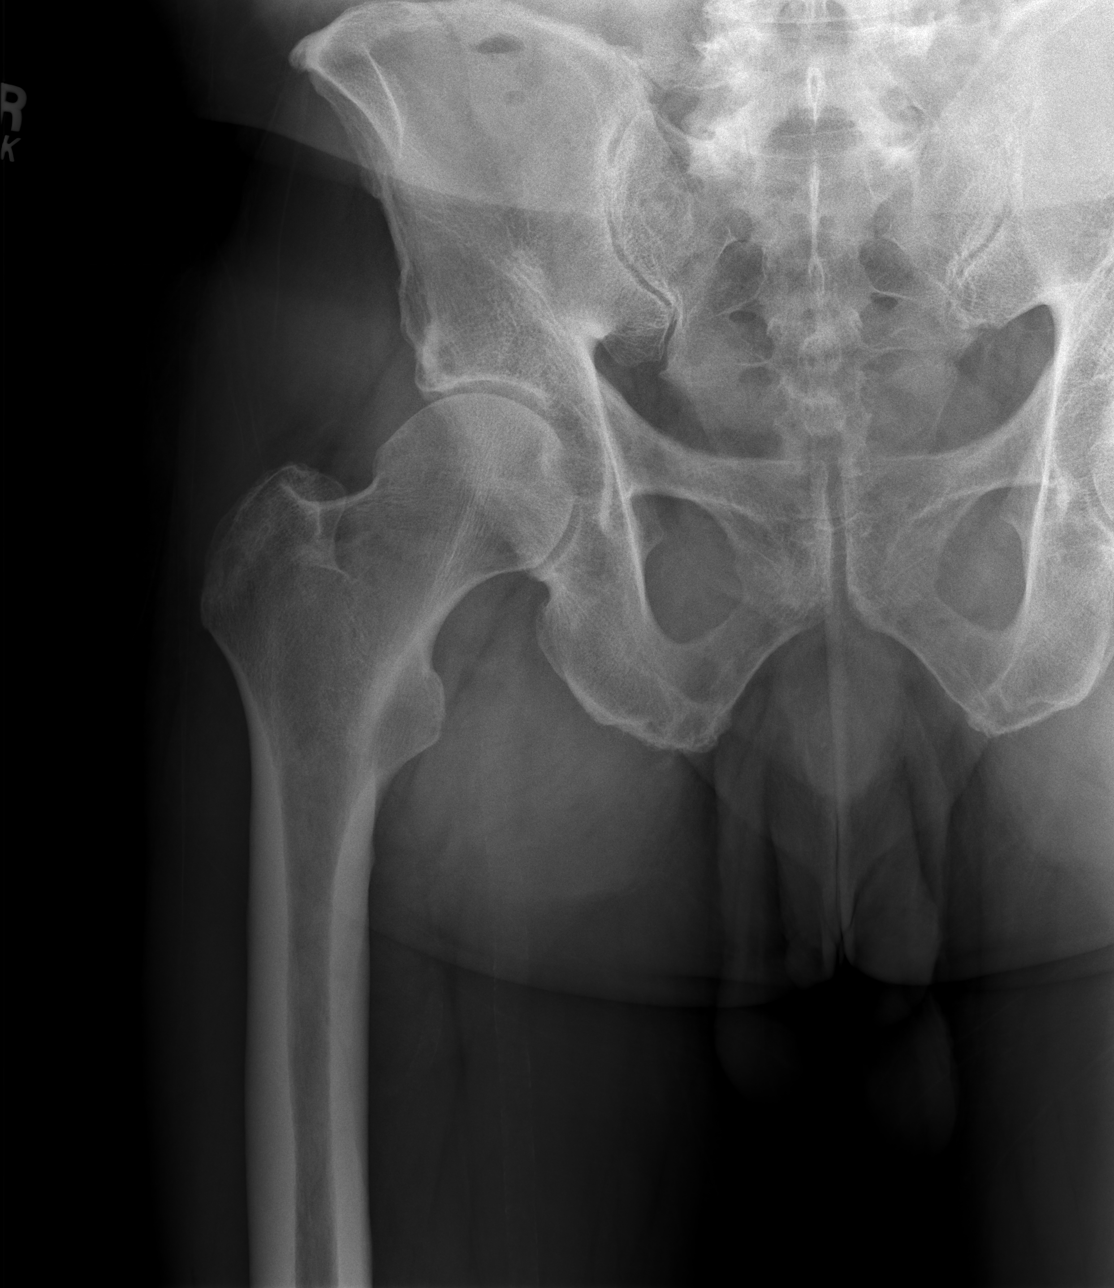

[w hip frog right]
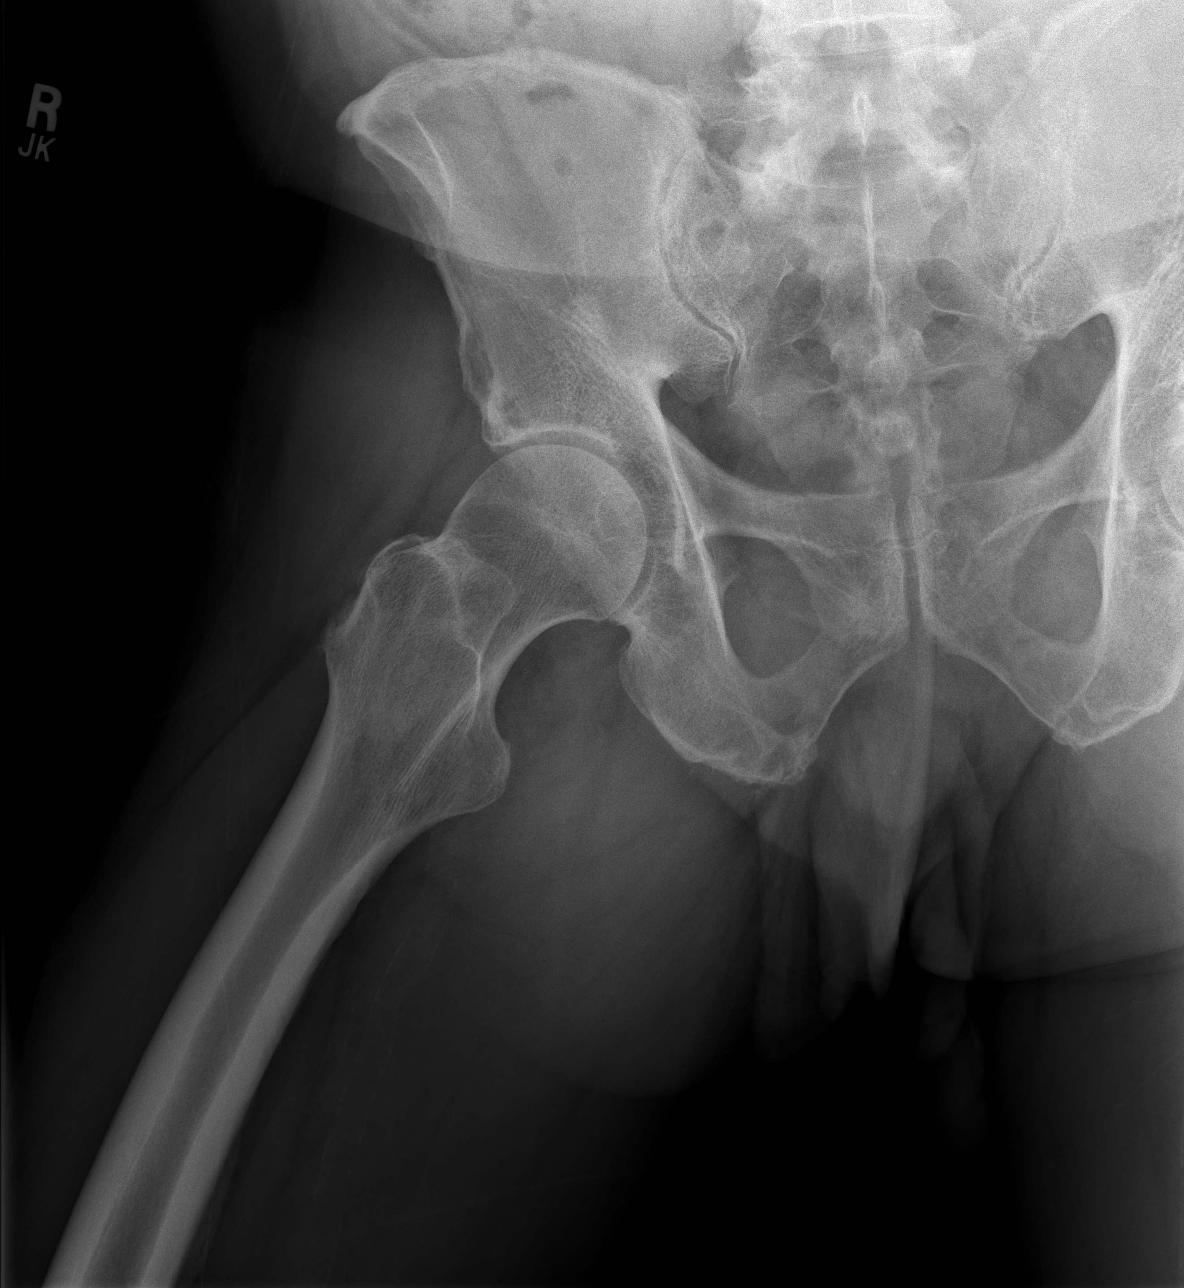

[3 of 3 positions shown; findings below may reference images not displayed]

FINDINGS: Standing frontal pelvis as well as standing frontal and lateral left
hip images obtained. No fracture or dislocation. There is mild
symmetric narrowing of each hip joint. No erosive change. Sacroiliac
joints appear normal bilaterally.
IMPRESSION: Mild symmetric narrowing of each hip joint. No fracture or
dislocation.

## 2021-12-29 NOTE — Progress Notes (Signed)
° °  12/29/2021  Patient ID:  George Jacobs is a 71 y.o. (DOB 1950-09-06) male.  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   Health Maintenance Due  Topic Date Due   Colorectal Cancer Screening  Never done   COVID-19 Vaccine (1) Never done   DTaP/Tdap/Td Vaccines (1 - Tdap) Never done   Zoster Vaccine (1 of 2) Never done   Pneumococcal Vaccine: 65+ Years (1 - PCV) Never done     Risks, benefits, and alternatives of the medications and treatment plan prescribed today were discussed, and patient expressed understanding. Plan follow-up as discussed or as needed if any worsening symptoms or change in condition.    A yearly preventative health exam was recommended and current age based recommendations were discussed.   Subjective   Patient ID:  George Jacobs is a 71 y.o. (DOB February 04, 1951) male    Patient presents with   Commercial Driver's License Exam     No care team member to display Social History   Tobacco Use   Smoking status: Never   Smokeless tobacco: Never  Substance Use Topics   Alcohol use: Not on file    Reviewed and updated this visit by provider: None       Review of Systems is complete and negative except as noted.  Objective   Vitals:   12/29/21 1411  BP: 138/88  Pulse: 58  Height: 6' 3 (1.905 m)  Weight: 194 lb 6.4 oz (88.2 kg)  BMI (Calculated): 24.3    Constitutional: Well-developed and well-nourished.  Sitting comfortably conversing normally.  Eyes: Conjunctivae, lids, and EOM are normal. Pupils are equal, round, and reactive to light. Lymphatics: There is no anterior or posterior cervical adenopathy. Neck: Supple with normal range of motion. No thyroid mass and no thyromegaly present. Cardiovascular: Regular rate and rhythm with normal heart  sounds and no edema present. There are no carotid bruits. Respiratory: Normal effort and clear to auscultation without wheezing or prolonged expiration. Gastrointestinal: Soft and nontender to palpation. Bowel sounds are normal. There is no hepatosplenomegaly. No umbilical hernia.  Musculoskeletal: Gait is normal. Upper and Lower extremities are symmetrical with grossly normal muscle strength and tone with normal range of motion.  Skin: Skin is warm and dry. No rashes or bruising noted.  Psychiatric: Behavior is Cooperative and Polite. Mood euthymyic. Affect is appropriate.    Patient's Medications    as of December 29, 2021  2:18 PM   You have not been prescribed any medications.     No orders of the defined types were placed in this encounter.    George Bohr, NP

## 2022-01-13 DIAGNOSIS — M65312 Trigger thumb, left thumb: Secondary | ICD-10-CM | POA: Insufficient documentation

## 2022-07-24 DIAGNOSIS — M5030 Other cervical disc degeneration, unspecified cervical region: Secondary | ICD-10-CM | POA: Insufficient documentation

## 2023-07-02 ENCOUNTER — Ambulatory Visit (INDEPENDENT_AMBULATORY_CARE_PROVIDER_SITE_OTHER): Payer: Medicare Other | Admitting: Podiatry

## 2023-07-02 ENCOUNTER — Encounter: Payer: Self-pay | Admitting: Podiatry

## 2023-07-02 DIAGNOSIS — M5416 Radiculopathy, lumbar region: Secondary | ICD-10-CM

## 2023-07-02 NOTE — Progress Notes (Signed)
   Chief Complaint  Patient presents with   Foot Pain    Bilateral feet - feeling of a frostbite sensation, decreased sensations, even though they are numb, coldness, feels like shoes aren't comfortable against feet, tried different size shoes and styles-no help, not diabetic   New Patient (Initial Visit)    HPI: 73 y.o. male presenting today as a new patient for evaluation of numbness with burning and tingling sensation of the bilateral feet ongoing for few years now.  Patient states that he is not diabetic.  He does have a history of lumbar radiculopathy and back pain.  He was recently seen by orthopedic spine specialist about 6 months ago.  Presenting for further treatment evaluation  Past Medical History:  Diagnosis Date   Bladder cancer (HCC) 05/2021   urologist--- dr gay   GERD (gastroesophageal reflux disease)    occasional, no meds   History of adenomatous polyp of colon    Lower urinary tract symptoms (LUTS)    Neuropathy    OA (osteoarthritis)    hips   Pre-diabetes    Wears glasses     Past Surgical History:  Procedure Laterality Date   COLONOSCOPY  04/13/2015   TONSILLECTOMY     child   TRANSURETHRAL RESECTION OF BLADDER TUMOR N/A 05/13/2021   Procedure: TRANSURETHRAL RESECTION OF BLADDER TUMOR (TURBT) WITH CYSTOSCOPY/ POSSIBLE INSTILLATION OF GEMCITABINE POST OPERATIVE;  Surgeon: Jannifer Hick, MD;  Location: [facility];  Service: Urology;  Laterality: N/A;   TRANSURETHRAL RESECTION OF BLADDER TUMOR N/A 06/06/2021   Procedure: TRANSURETHRAL RESECTION OF BLADDER TUMOR (TURBT)/ CYSTOSCOPY/;  Surgeon: Jannifer Hick, MD;  Location: [facility];  Service: Urology;  Laterality: N/A;  ONLY NEEDS 30 MIN    Allergies  Allergen Reactions   Diclofenac Sodium     Other Reaction(s): stomach upset     Physical Exam: General: The patient is alert and oriented x3 in no acute distress.  Dermatology: Skin is warm, dry and supple bilateral lower  extremities.   Vascular: Palpable pedal pulses bilaterally. Capillary refill within normal limits.  No appreciable edema.  No erythema.  Neurological: Grossly intact via light touch  Musculoskeletal Exam: No pedal deformities noted   Assessment/Plan of Care: 1.  Lumbar radiculopathy affecting the bilateral feet  -Patient evaluated.  Explained to the patient that his main contributing factor to the numbness and tingling in the feet is likely his lumbar spine.  Patient agrees that he does have a history of back problems and when he is more active and strenuous on his back he gets an exacerbation of his numbness and tingling of the feet -Continue management with orthospine -Return to clinic as needed       Felecia Shelling, DPM [facility]  Dr. Felecia Shelling, DPM    [address] [place], [place] [address]                Office [phone]  Fax [phone]

## 2023-09-06 ENCOUNTER — Other Ambulatory Visit (HOSPITAL_BASED_OUTPATIENT_CLINIC_OR_DEPARTMENT_OTHER): Payer: Self-pay | Admitting: Urology

## 2023-09-06 DIAGNOSIS — D494 Neoplasm of unspecified behavior of bladder: Secondary | ICD-10-CM

## 2023-09-07 ENCOUNTER — Ambulatory Visit (HOSPITAL_BASED_OUTPATIENT_CLINIC_OR_DEPARTMENT_OTHER)
Admission: RE | Admit: 2023-09-07 | Discharge: 2023-09-07 | Disposition: A | Discharge status: Home / Self Care | Source: Ambulatory Visit | Attending: Urology | Admitting: Urology

## 2023-09-07 DIAGNOSIS — D494 Neoplasm of unspecified behavior of bladder: Secondary | ICD-10-CM | POA: Insufficient documentation

## 2023-09-07 MED ORDER — IOHEXOL 300 MG/ML  SOLN
125.0000 mL | Freq: Once | INTRAMUSCULAR | Status: AC | PRN
  Administered 2023-09-07: 125 mL via INTRAVENOUS

## 2024-04-10 ENCOUNTER — Other Ambulatory Visit: Payer: Self-pay | Admitting: Urology

## 2024-04-11 NOTE — Patient Instructions (Signed)
 SURGICAL WAITING ROOM VISITATION  Patients having surgery or a procedure may have no more than 2 support people in the waiting area - these visitors may rotate.    Children ages 57 and under will not be able to visit patients in [facility] under most circumstances.   Visitors with respiratory illnesses are discouraged from visiting and should remain at home.  If the patient needs to stay at the hospital during part of their recovery, the visitor guidelines for inpatient rooms apply. Pre-op nurse will coordinate an appropriate time for 1 support person to accompany patient in pre-op.  This support person may not rotate.    Please refer to the [facility] website for the visitor guidelines for Inpatients (after your surgery is over and you are in a regular room).    Your procedure is scheduled on: 04/21/24   Report to [facility] Main Entrance    Report to admitting at 9:15 AM   Call this number if you have problems the morning of surgery [phone]   Do not eat food or drink liquids :After Midnight.          If you have questions, please contact your surgeons office.   FOLLOW BOWEL PREP AND ANY ADDITIONAL PRE OP INSTRUCTIONS YOU RECEIVED FROM YOUR SURGEON'S OFFICE!!!     Oral Hygiene is also important to reduce your risk of infection.                                    Remember - BRUSH YOUR TEETH THE MORNING OF SURGERY WITH YOUR REGULAR TOOTHPASTE  DENTURES WILL BE REMOVED PRIOR TO SURGERY PLEASE DO NOT APPLY Poly grip OR ADHESIVES!!!   Stop all vitamins and herbal supplements 7 days before surgery.   Take these medicines the morning of surgery with A SIP OF WATER :  None                              You may not have any metal on your body including jewelry, and body piercing             Do not wear lotions, powders, cologne, or deodorant              Men may shave face and neck.   Do not bring valuables to the hospital. [facility] IS NOT              RESPONSIBLE   FOR VALUABLES.   Contacts, glasses, dentures or bridgework may not be worn into surgery.  DO NOT BRING YOUR HOME MEDICATIONS TO THE HOSPITAL. PHARMACY WILL DISPENSE MEDICATIONS LISTED ON YOUR MEDICATION LIST TO YOU DURING YOUR ADMISSION IN THE HOSPITAL!    Patients discharged on the day of surgery will not be allowed to drive home.  Someone NEEDS to stay with you for the first 24 hours after anesthesia.              Please read over the following fact sheets you were given: IF YOU HAVE QUESTIONS ABOUT YOUR PRE-OP INSTRUCTIONS PLEASE CALL [phone] [place].   If you received a COVID test during your pre-op visit  it is requested that you wear a mask when out in public, stay away from anyone that may not be feeling well and notify your surgeon if you develop symptoms. If you test positive for Covid or  have been in contact with anyone that has tested positive in the last 10 days please notify you surgeon.    [facility] - Preparing for Surgery Before surgery, you can play an important role.  Because skin is not sterile, your skin needs to be as free of germs as possible.  You can reduce the number of germs on your skin by washing with CHG (chlorahexidine gluconate) soap before surgery.  CHG is an antiseptic cleaner which kills germs and bonds with the skin to continue killing germs even after washing. Please DO NOT use if you have an allergy to CHG or antibacterial soaps.  If your skin becomes reddened/irritated stop using the CHG and inform your nurse when you arrive at Short Stay. Do not shave (including legs and underarms) for at least 48 hours prior to the first CHG shower.  You may shave your face/neck.  Please follow these instructions carefully:  1.  Shower with CHG Soap the night before surgery ONLY (DO NOT USE THE SOAP THE MORNING OF SURGERY).  2.  If you choose to wash your hair, wash your hair first as usual with your normal  shampoo.  3.  After you shampoo, rinse your  hair and body thoroughly to remove the shampoo.                             4.  Use CHG as you would any other liquid soap.  You can apply chg directly to the skin and wash.  Gently with a scrungie or clean washcloth.  5.  Apply the CHG Soap to your body ONLY FROM THE NECK DOWN.   Do   not use on face/ open                           Wound or open sores. Avoid contact with eyes, ears mouth and   genitals (private parts).                       Wash face,  Genitals (private parts) with your normal soap.             6.  Wash thoroughly, paying special attention to the area where your    surgery  will be performed.  7.  Thoroughly rinse your body with warm water  from the neck down.  8.  DO NOT shower/wash with your normal soap after using and rinsing off the CHG Soap.                9.  Pat yourself dry with a clean towel.            10.  Wear clean pajamas.            11.  Place clean sheets on your bed the night of your first shower and do not  sleep with pets. Day of Surgery : Do not apply any CHG, lotions/deodorants the morning of surgery.  Please wear clean clothes to the hospital/surgery center.  FAILURE TO FOLLOW THESE INSTRUCTIONS MAY RESULT IN THE CANCELLATION OF YOUR SURGERY  PATIENT SIGNATURE_________________________________  NURSE SIGNATURE__________________________________  ________________________________________________________________________

## 2024-04-11 NOTE — H&P (Addendum)
 Urology Preoperative H&P   Chief Complaint: Bladder cancer  History of Present Illness: George Jacobs is a 73 y.o. male with bladder cancer with recurrent patchy erythema here for TURBT with gemcitabine . Denies fevers, chills, dysuria.    Past Medical History:  Diagnosis Date   Bladder cancer [facility]) 05/2021   urologist--- dr Kaz Auld   GERD (gastroesophageal reflux disease)    occasional, no meds   History of adenomatous polyp of colon    Lower urinary tract symptoms (LUTS)    Neuropathy    OA (osteoarthritis)    hips   Pre-diabetes    Wears glasses     Past Surgical History:  Procedure Laterality Date   COLONOSCOPY  04/13/2015   TONSILLECTOMY     child   TRANSURETHRAL RESECTION OF BLADDER TUMOR N/A 05/13/2021   Procedure: TRANSURETHRAL RESECTION OF BLADDER TUMOR (TURBT) WITH CYSTOSCOPY/ POSSIBLE INSTILLATION OF GEMCITABINE  POST OPERATIVE;  Surgeon: Selma Donnice SAUNDERS, MD;  Location: [facility];  Service: Urology;  Laterality: N/A;   TRANSURETHRAL RESECTION OF BLADDER TUMOR N/A 06/06/2021   Procedure: TRANSURETHRAL RESECTION OF BLADDER TUMOR (TURBT)/ CYSTOSCOPY/;  Surgeon: Selma Donnice SAUNDERS, MD;  Location: [facility];  Service: Urology;  Laterality: N/A;  ONLY NEEDS 30 MIN    Allergies: Allergies[1]  History reviewed. No pertinent family history.  Social History:  reports that he has never smoked. He has never used smokeless tobacco. He reports current alcohol use. He reports that he does not use drugs.  ROS: A complete review of systems was performed.  All systems are negative except for pertinent findings as noted.  Physical Exam:  Vital signs in last 24 hours: Temp:  [98.2 F (36.8 C)] 98.2 F (36.8 C) (12/22 0929) Pulse Rate:  [72] 72 (12/22 0929) Resp:  [16] 16 (12/22 0929) BP: (165)/(84) 165/84 (12/22 0929) SpO2:  [98 %] 98 % (12/22 0929) Constitutional:  Alert and oriented, No acute distress Cardiovascular: Regular rate and  rhythm Respiratory: Normal respiratory effort, Lungs clear bilaterally GI: Abdomen is soft, nontender, nondistended, no abdominal masses GU: No CVA tenderness Lymphatic: No lymphadenopathy Neurologic: Grossly intact, no focal deficits Psychiatric: Normal mood and affect  Laboratory Data:  No results for input(s): WBC, HGB, HCT, PLT in the last 72 hours.  No results for input(s): NA, K, CL, GLUCOSE, BUN, CALCIUM, CREATININE in the last 72 hours.  Invalid input(s): CO3   No results found for this or any previous visit (from the past 24 hours). No results found for this or any previous visit (from the past 240 hours).  Renal Function: No results for input(s): CREATININE in the last 168 hours. CrCl cannot be calculated (Patient's most recent lab result is older than the maximum 21 days allowed.).  Radiologic Imaging: No results found.  I independently reviewed the above imaging studies.  Assessment and Plan George Jacobs is a 73 y.o. male with bladder cancer with recurrent patchy erythema here for TURBT with gemcitabine .    [name] Garik Diamant MD 04/21/2024, 9:33 AM  [facility] Pager: [phone]): [phone]     [1]  Allergies Allergen Reactions   Diclofenac Sodium     Other Reaction(s): stomach upset

## 2024-04-11 NOTE — Progress Notes (Addendum)
 Date of COVID positive in last 90 days:  PCP - Elsie Lesches, MD Cardiologist - n/a  Chest x-ray - N/A EKG - 04/14/24 Epic/chart Stress Test - N/A ECHO - N/A Cardiac Cath - N/A Pacemaker/ICD device last checked:N/A Spinal Cord Stimulator:N/A  Bowel Prep - N/A  Sleep Study - N/A CPAP -   Fasting Blood Sugar - pre DM Checks Blood Sugar _____ times a day  Last dose of GLP1 agonist-  N/A GLP1 instructions:  Do not take after     Last dose of SGLT-2 inhibitors-  N/A SGLT-2 instructions:  Do not take after     Blood Thinner Instructions: N/A Last dose:   Time: Aspirin Instructions:N/A Last Dose:  Activity level: Can go up a flight of stairs and perform activities of daily living without stopping and without symptoms of chest pain or shortness of breath.  Anesthesia review: EKG PVCs, HTN, had flu 3 weeks ago. Still has a mild cough. Denies any other symptoms.   Patient denies shortness of breath, fever, cough and chest pain at PAT appointment  Patient verbalized understanding of instructions that were given to them at the PAT appointment. Patient was also instructed that they will need to review over the PAT instructions again at home before surgery.

## 2024-04-11 NOTE — Progress Notes (Signed)
 Please place orders for PAT appointment scheduled 04/14/24.

## 2024-04-14 ENCOUNTER — Other Ambulatory Visit: Payer: Self-pay

## 2024-04-14 ENCOUNTER — Encounter (HOSPITAL_COMMUNITY): Payer: Self-pay

## 2024-04-14 ENCOUNTER — Encounter (HOSPITAL_COMMUNITY)
Admission: RE | Admit: 2024-04-14 | Discharge: 2024-04-14 | Disposition: A | Source: Ambulatory Visit | Attending: Urology

## 2024-04-14 VITALS — BP 155/97 | HR 78 | Temp 98.8°F | Resp 18 | Ht 75.0 in | Wt 202.0 lb

## 2024-04-14 DIAGNOSIS — Z01818 Encounter for other preprocedural examination: Secondary | ICD-10-CM | POA: Diagnosis not present

## 2024-04-14 DIAGNOSIS — Z01812 Encounter for preprocedural laboratory examination: Secondary | ICD-10-CM | POA: Diagnosis present

## 2024-04-14 DIAGNOSIS — Z01810 Encounter for preprocedural cardiovascular examination: Secondary | ICD-10-CM | POA: Diagnosis present

## 2024-04-21 ENCOUNTER — Encounter (HOSPITAL_COMMUNITY): Payer: Self-pay | Admitting: Medical

## 2024-04-21 ENCOUNTER — Encounter: Admission: RE | Disposition: A | Payer: Self-pay | Attending: Urology

## 2024-04-21 ENCOUNTER — Ambulatory Visit (HOSPITAL_COMMUNITY)
Admission: RE | Admit: 2024-04-21 | Discharge: 2024-04-21 | Disposition: A | Discharge status: Home / Self Care | Source: Ambulatory Visit | Attending: Urology | Admitting: Urology

## 2024-04-21 ENCOUNTER — Encounter (HOSPITAL_COMMUNITY): Payer: Self-pay | Admitting: Urology

## 2024-04-21 ENCOUNTER — Ambulatory Visit (HOSPITAL_COMMUNITY): Payer: Self-pay

## 2024-04-21 DIAGNOSIS — C678 Malignant neoplasm of overlapping sites of bladder: Secondary | ICD-10-CM | POA: Diagnosis present

## 2024-04-21 DIAGNOSIS — K219 Gastro-esophageal reflux disease without esophagitis: Secondary | ICD-10-CM | POA: Insufficient documentation

## 2024-04-21 DIAGNOSIS — C679 Malignant neoplasm of bladder, unspecified: Secondary | ICD-10-CM | POA: Insufficient documentation

## 2024-04-21 DIAGNOSIS — C671 Malignant neoplasm of dome of bladder: Secondary | ICD-10-CM

## 2024-04-21 HISTORY — PX: TRANSURETHRAL RESECTION OF BLADDER TUMOR: SHX2575

## 2024-04-21 HISTORY — PX: CYSTOSCOPY: SHX5120

## 2024-04-21 SURGERY — TURBT (TRANSURETHRAL RESECTION OF BLADDER TUMOR)
Anesthesia: General | Site: Bladder

## 2024-04-21 MED ORDER — ACETAMINOPHEN 10 MG/ML IV SOLN: 1000.0000 mg | Freq: Once | INTRAVENOUS | Status: DC | PRN

## 2024-04-21 MED ORDER — SUGAMMADEX SODIUM 200 MG/2ML IV SOLN
INTRAVENOUS | Status: DC | PRN
  Administered 2024-04-21: 200 mg via INTRAVENOUS

## 2024-04-21 MED ORDER — SODIUM CHLORIDE 0.9 % IR SOLN
Status: DC | PRN
  Administered 2024-04-21: 3000 mL via INTRAVESICAL

## 2024-04-21 MED ORDER — OXYCODONE-ACETAMINOPHEN 5-325 MG PO TABS: 1.0000 | ORAL_TABLET | ORAL | 0 refills | Status: AC | PRN

## 2024-04-21 MED ORDER — SUGAMMADEX SODIUM 200 MG/2ML IV SOLN
INTRAVENOUS | Status: AC
  Filled 2024-04-21: qty 2

## 2024-04-21 MED ORDER — MIDAZOLAM HCL 5 MG/5ML IJ SOLN
INTRAMUSCULAR | Status: DC | PRN
  Administered 2024-04-21: 2 mg via INTRAVENOUS

## 2024-04-21 MED ORDER — LACTATED RINGERS IV SOLN: INTRAVENOUS | Status: DC

## 2024-04-21 MED ORDER — OXYCODONE HCL 5 MG PO TABS: 5.0000 mg | ORAL_TABLET | Freq: Once | ORAL | Status: DC | PRN

## 2024-04-21 MED ORDER — PROPOFOL 10 MG/ML IV BOLUS
INTRAVENOUS | Status: AC
  Filled 2024-04-21: qty 20

## 2024-04-21 MED ORDER — DROPERIDOL 2.5 MG/ML IJ SOLN: 0.6250 mg | Freq: Once | INTRAMUSCULAR | Status: DC | PRN

## 2024-04-21 MED ORDER — FENTANYL CITRATE (PF) 50 MCG/ML IJ SOSY: 25.0000 ug | PREFILLED_SYRINGE | INTRAMUSCULAR | Status: DC | PRN

## 2024-04-21 MED ORDER — CHLORHEXIDINE GLUCONATE 0.12 % MT SOLN
15.0000 mL | Freq: Once | OROMUCOSAL | Status: AC
  Administered 2024-04-21: 15 mL via OROMUCOSAL

## 2024-04-21 MED ORDER — OXYCODONE HCL 5 MG/5ML PO SOLN: 5.0000 mg | Freq: Once | ORAL | Status: DC | PRN

## 2024-04-21 MED ORDER — ORAL CARE MOUTH RINSE: 15.0000 mL | Freq: Once | OROMUCOSAL | Status: AC

## 2024-04-21 MED ORDER — CEFAZOLIN SODIUM-DEXTROSE 2-4 GM/100ML-% IV SOLN
2.0000 g | INTRAVENOUS | Status: AC
  Administered 2024-04-21: 2 g via INTRAVENOUS
  Filled 2024-04-21: qty 100

## 2024-04-21 MED ORDER — PROPOFOL 10 MG/ML IV BOLUS
INTRAVENOUS | Status: DC | PRN
  Administered 2024-04-21: 150 mg via INTRAVENOUS

## 2024-04-21 MED ORDER — FENTANYL CITRATE (PF) 100 MCG/2ML IJ SOLN
INTRAMUSCULAR | Status: DC | PRN
  Administered 2024-04-21 (×2): 50 ug via INTRAVENOUS

## 2024-04-21 MED ORDER — ROCURONIUM BROMIDE 100 MG/10ML IV SOLN
INTRAVENOUS | Status: DC | PRN
  Administered 2024-04-21: 50 mg via INTRAVENOUS

## 2024-04-21 MED ORDER — ROCURONIUM BROMIDE 10 MG/ML (PF) SYRINGE
PREFILLED_SYRINGE | INTRAVENOUS | Status: AC
  Filled 2024-04-21: qty 10

## 2024-04-21 MED ORDER — ACETAMINOPHEN 10 MG/ML IV SOLN
INTRAVENOUS | Status: AC
  Filled 2024-04-21: qty 100

## 2024-04-21 MED ORDER — FENTANYL CITRATE (PF) 100 MCG/2ML IJ SOLN
INTRAMUSCULAR | Status: AC
  Filled 2024-04-21: qty 2

## 2024-04-21 MED ORDER — LIDOCAINE HCL (CARDIAC) PF 100 MG/5ML IV SOSY
PREFILLED_SYRINGE | INTRAVENOUS | Status: DC | PRN
  Administered 2024-04-21: 100 mg via INTRAVENOUS

## 2024-04-21 MED ORDER — STERILE WATER FOR IRRIGATION IR SOLN
Status: DC | PRN
  Administered 2024-04-21: 500 mL

## 2024-04-21 MED ORDER — DEXAMETHASONE SOD PHOSPHATE PF 10 MG/ML IJ SOLN
INTRAMUSCULAR | Status: DC | PRN
  Administered 2024-04-21: 5 mg via INTRAVENOUS

## 2024-04-21 MED ORDER — LIDOCAINE HCL (PF) 2 % IJ SOLN
INTRAMUSCULAR | Status: AC
  Filled 2024-04-21: qty 5

## 2024-04-21 MED ORDER — ONDANSETRON HCL 4 MG/2ML IJ SOLN
INTRAMUSCULAR | Status: DC | PRN
  Administered 2024-04-21: 4 mg via INTRAVENOUS

## 2024-04-21 MED ORDER — ACETAMINOPHEN 10 MG/ML IV SOLN
INTRAVENOUS | Status: DC | PRN
  Administered 2024-04-21: 1000 mg via INTRAVENOUS

## 2024-04-21 MED ORDER — ONDANSETRON HCL 4 MG/2ML IJ SOLN
INTRAMUSCULAR | Status: AC
  Filled 2024-04-21: qty 2

## 2024-04-21 MED ORDER — MIDAZOLAM HCL 2 MG/2ML IJ SOLN
INTRAMUSCULAR | Status: AC
  Filled 2024-04-21: qty 2

## 2024-04-21 SURGICAL SUPPLY — 22 items
BAG URINE DRAIN 2000ML AR STRL (UROLOGICAL SUPPLIES) IMPLANT
BAG URO CATCHER STRL LF (MISCELLANEOUS) ×2 IMPLANT
CATH FOLEY 2WAY SLVR 5CC 18FR (CATHETERS) IMPLANT
CATH URETL OPEN 5X70 (CATHETERS) IMPLANT
CLOTH BEACON ORANGE TIMEOUT ST (SAFETY) ×2 IMPLANT
DRAPE FOOT SWITCH (DRAPES) ×2 IMPLANT
ELECT REM PT RETURN 15FT ADLT (MISCELLANEOUS) ×2 IMPLANT
EVACUATOR MICROVAS BLADDER (UROLOGICAL SUPPLIES) IMPLANT
GLOVE BIOGEL M 7.0 STRL (GLOVE) ×2 IMPLANT
GOWN STRL REUS W/ TWL XL LVL3 (GOWN DISPOSABLE) ×2 IMPLANT
GUIDEWIRE STR DUAL SENSOR (WIRE) ×2 IMPLANT
GUIDEWIRE ZIPWRE .038 STRAIGHT (WIRE) IMPLANT
HOLDER FOLEY CATH W/STRAP (MISCELLANEOUS) IMPLANT
KIT TURNOVER KIT A (KITS) ×2 IMPLANT
LOOP CUT BIPOLAR 24F LRG (ELECTROSURGICAL) IMPLANT
MANIFOLD NEPTUNE II (INSTRUMENTS) ×2 IMPLANT
PACK CYSTO (CUSTOM PROCEDURE TRAY) ×2 IMPLANT
SHEATH DILATOR SET 8/10 (MISCELLANEOUS) IMPLANT
SYR 10ML LL (SYRINGE) ×2 IMPLANT
SYRINGE TOOMEY IRRIG 70ML (MISCELLANEOUS) IMPLANT
TUBING CONNECTING 10 (TUBING) ×2 IMPLANT
TUBING UROLOGY SET (TUBING) ×2 IMPLANT

## 2024-04-21 NOTE — Anesthesia Preprocedure Evaluation (Signed)
"                                    Anesthesia Evaluation  Patient identified by MRN, date of birth, ID band Patient awake    Reviewed: Allergy & Precautions, NPO status , Patient's Chart, lab work & pertinent test results  History of Anesthesia Complications Negative for: history of anesthetic complications  Airway Mallampati: II  TM Distance: >3 FB Neck ROM: Full    Dental no notable dental hx. (+) Teeth Intact   Pulmonary neg sleep apnea, neg COPD, Recent URI , Residual Cough, Patient abstained from smoking.Not current smoker Patient had influenza, resolved two weeks ago. Has residual mild nasal congestion and throat clearing. Denies SOB, fevers, productive sputum   Pulmonary exam normal breath sounds clear to auscultation       Cardiovascular Exercise Tolerance: Good METShypertension, (-) CAD and (-) Past MI (-) dysrhythmias  Rhythm:Regular Rate:Normal - Systolic murmurs    Neuro/Psych negative neurological ROS  negative psych ROS   GI/Hepatic ,GERD  Controlled,,(+)     (-) substance abuse    Endo/Other  neg diabetes    Renal/GU Renal disease     Musculoskeletal   Abdominal   Peds  Hematology   Anesthesia Other Findings Past Medical History: 05/2021: Bladder cancer [facility])     Comment:  urologist--- dr gay No date: GERD (gastroesophageal reflux disease)     Comment:  occasional, no meds No date: History of adenomatous polyp of colon No date: Lower urinary tract symptoms (LUTS) No date: Neuropathy No date: OA (osteoarthritis)     Comment:  hips No date: Pre-diabetes No date: Wears glasses  Reproductive/Obstetrics                              Anesthesia Physical Anesthesia Plan  ASA: 2  Anesthesia Plan: General   Post-op Pain Management: Ofirmev  IV (intra-op)*   Induction: Intravenous  PONV Risk Score and Plan: 3 and Ondansetron , Dexamethasone  and Midazolam   Airway Management Planned: Oral  ETT  Additional Equipment: None  Intra-op Plan:   Post-operative Plan: Extubation in OR  Informed Consent: I have reviewed the patients History and Physical, chart, labs and discussed the procedure including the risks, benefits and alternatives for the proposed anesthesia with the patient or authorized representative who has indicated his/her understanding and acceptance.     Dental advisory given  Plan Discussed with: CRNA and Surgeon  Anesthesia Plan Comments: (Discussed risks of anesthesia with patient, including PONV, sore throat, lip/dental/eye damage. Discussed potential increased risk with recent URI, patient udnerstands and accepts. Reasonable to proceed given no SOB or fevers or cough. Rare risks discussed as well, such as cardiorespiratory and neurological sequelae, and allergic reactions. Discussed the role of CRNA in patient's perioperative care. Patient understands.)        Anesthesia Quick Evaluation  "

## 2024-04-21 NOTE — Anesthesia Postprocedure Evaluation (Signed)
"   Anesthesia Post Note  Patient: Kendre Jacinto  Procedure(s) Performed: TURBT (TRANSURETHRAL RESECTION OF BLADDER TUMOR) (Bladder) CYSTOSCOPY     Patient location during evaluation: PACU Anesthesia Type: General Level of consciousness: awake and alert Pain management: pain level controlled Vital Signs Assessment: post-procedure vital signs reviewed and stable Respiratory status: spontaneous breathing, nonlabored ventilation, respiratory function stable and patient connected to nasal cannula oxygen Cardiovascular status: blood pressure returned to baseline and stable Postop Assessment: no apparent nausea or vomiting Anesthetic complications: no   No notable events documented.  Last Vitals:  Vitals:   04/21/24 1200 04/21/24 1215  BP: 133/79 (!) 144/84  Pulse: 69 62  Resp: (!) 24 18  Temp:    SpO2: 99% 98%    Last Pain:  Vitals:   04/21/24 1215  TempSrc:   PainSc: 0-No pain                 Rome Ade      "

## 2024-04-21 NOTE — Anesthesia Procedure Notes (Addendum)
 Procedure Name: Intubation Date/Time: 04/21/2024 11:10 AM  Performed by: Belvie Valri NOVAK, CRNAPre-anesthesia Checklist: Patient identified, Emergency Drugs available, Suction available and Patient being monitored Patient Re-evaluated:Patient Re-evaluated prior to induction Oxygen Delivery Method: Circle System Utilized Preoxygenation: Pre-oxygenation with 100% oxygen Induction Type: IV induction Ventilation: Mask ventilation without difficulty and Oral airway inserted - appropriate to patient size Laryngoscope Size: Mac and 4 Grade View: Grade I Tube type: Oral Number of attempts: 1 Airway Equipment and Method: Stylet and Oral airway Placement Confirmation: ETT inserted through vocal cords under direct vision, positive ETCO2 and breath sounds checked- equal and bilateral Secured at: 24 cm Tube secured with: Tape Dental Injury: Teeth and Oropharynx as per pre-operative assessment

## 2024-04-21 NOTE — Discharge Instructions (Signed)
Activity:  You are encouraged to ambulate frequently (about every hour during waking hours) to help prevent blood clots from forming in your legs or lungs.   ? ?Diet: You should advance your diet as instructed by your physician.  It will be normal to have some bloating, nausea, and abdominal discomfort intermittently. ? ?Prescriptions:  You will be provided a prescription for pain medication to take as needed.  If your pain is not severe enough to require the prescription pain medication, you may take extra strength Tylenol instead which will have less side effects.  You should also take a prescribed stool softener to avoid straining with bowel movements as the prescription pain medication may constipate you. ? ?What to call us about: You should call the office ([phone]) if you develop fever > 101 or develop persistent vomiting. Activity:  You are encouraged to ambulate frequently (about every hour during waking hours) to help prevent blood clots from forming in your legs or lungs.  ?

## 2024-04-21 NOTE — Op Note (Signed)
 Operative Note  Preoperative diagnosis:  1.  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  Same  Procedure(s): 1.  TURBT small  Surgeon: Donnice Siad, MD  Assistants:  None  Anesthesia:  General  Complications:  None  EBL: Minimal  Specimens: 1.  ID Type Source Tests Collected by Time Destination  1 : Bladder Tumor Tissue PATH GU resection / TURBT / partial nephrectomy SURGICAL PATHOLOGY Siad Donnice SAUNDERS, MD 04/21/2024 1119     Drains/Catheters: 1.  None  Intraoperative findings:   1 x 1 cm area of patchy erythema on the dome suspicious for CIS.  No papillary lesions.  Indication:  George Jacobs is a 73 y.o. male with a history of intermediate risk nonmuscle base of bladder cancer with recurrent patchy erythema here for TURBT.  All the risks, benefits were discussed with the patient to include but not limited to infection, pain, bleeding, damage to adjacent structures, need for further operations, adverse reaction to anesthesia and death.  Patient understands these risks and agrees to proceed with the operation as planned.    Description of procedure: After informed consent was obtained from the patient, the patient was taken to the operating room. General anesthesia was administered. The patient was placed in dorsal lithotomy position and prepped and draped in usual sterile fashion. Sequential compression devices were applied to lower extremities at the beginning of the case for DVT prophylaxis. Antibiotics were infused prior to surgery start time. A surgical time-out was performed to properly identify the patient, the surgery to be performed, and the surgical site.     We then passed the 21-French rigid cystoscope down the urethra and into the bladder under direct vision without any difficulty. The anterior urethral was normal. The prostate was non-obstructing. The bladder was inspected  with 30 and 70 degree lenses. Once in the bladder, systematic evaluation of bladder revealed area of 1 x 1 cm patchy erythema on the dome. The ureteral orfices were in orthotopic position and not involved.   We then removed the cystoscope and then passed down the 26 French resectoscope sheath down the urethra into the bladder under direct vision with the visual obturator. The tumor was resected down to muscle. The TUR bladder tumor chips were retrieved from the bladder and each region of resection was passed off the field as a separate specimen.  Hemostasis was achieved using electrocautery. The patient tolerated the procedure well with no complication and was awoken from anesthesia and taken to recovery in stable condition.     [name] Willliam Pettet MD [facility]  Pager: [phone]

## 2024-04-21 NOTE — Transfer of Care (Signed)
 Immediate Anesthesia Transfer of Care Note  Patient: George Jacobs  Procedure(s) Performed: TURBT (TRANSURETHRAL RESECTION OF BLADDER TUMOR) (Bladder) CYSTOSCOPY  Patient Location: PACU  Anesthesia Type:General  Level of Consciousness: drowsy and patient cooperative  Airway & Oxygen Therapy: Patient Spontanous Breathing and Patient connected to face mask oxygen  Post-op Assessment: Report given to RN and Post -op Vital signs reviewed and stable  Post vital signs: Reviewed and stable  Last Vitals:  Vitals Value Taken Time  BP 145/88 04/21/24 11:41  Temp    Pulse 68 04/21/24 11:44  Resp 18 04/21/24 11:44  SpO2 100 % 04/21/24 11:44  Vitals shown include unfiled device data.  Last Pain:  Vitals:   04/21/24 0929  TempSrc: Oral  PainSc:          Complications: No notable events documented.

## 2024-04-22 ENCOUNTER — Encounter (HOSPITAL_COMMUNITY): Payer: Self-pay | Admitting: Urology

## 2024-04-22 LAB — SURGICAL PATHOLOGY

## 2024-04-27 ENCOUNTER — Encounter (HOSPITAL_BASED_OUTPATIENT_CLINIC_OR_DEPARTMENT_OTHER): Payer: Self-pay | Admitting: Emergency Medicine

## 2024-04-27 ENCOUNTER — Emergency Department (HOSPITAL_BASED_OUTPATIENT_CLINIC_OR_DEPARTMENT_OTHER)
Admission: EM | Admit: 2024-04-27 | Discharge: 2024-04-27 | Disposition: A | Discharge status: Home / Self Care | Attending: Emergency Medicine | Admitting: Emergency Medicine

## 2024-04-27 DIAGNOSIS — R319 Hematuria, unspecified: Secondary | ICD-10-CM | POA: Diagnosis present

## 2024-04-27 DIAGNOSIS — Z8551 Personal history of malignant neoplasm of bladder: Secondary | ICD-10-CM | POA: Diagnosis not present

## 2024-04-27 DIAGNOSIS — R310 Gross hematuria: Secondary | ICD-10-CM | POA: Insufficient documentation

## 2024-04-27 LAB — CBC WITH DIFFERENTIAL/PLATELET
Abs Immature Granulocytes: 0 K/uL (ref 0.00–0.07)
Basophils Absolute: 0 K/uL (ref 0.0–0.1)
Basophils Relative: 1 %
Eosinophils Absolute: 0.1 K/uL (ref 0.0–0.5)
Eosinophils Relative: 3 %
HCT: 42 % (ref 39.0–52.0)
Hemoglobin: 14.2 g/dL (ref 13.0–17.0)
Immature Granulocytes: 0 %
Lymphocytes Relative: 32 %
Lymphs Abs: 1.3 K/uL (ref 0.7–4.0)
MCH: 29.4 pg (ref 26.0–34.0)
MCHC: 33.8 g/dL (ref 30.0–36.0)
MCV: 87 fL (ref 80.0–100.0)
Monocytes Absolute: 0.4 K/uL (ref 0.1–1.0)
Monocytes Relative: 9 %
Neutro Abs: 2.2 K/uL (ref 1.7–7.7)
Neutrophils Relative %: 55 %
Platelets: 172 K/uL (ref 150–400)
RBC: 4.83 MIL/uL (ref 4.22–5.81)
RDW: 12.4 % (ref 11.5–15.5)
WBC: 4 K/uL (ref 4.0–10.5)
nRBC: 0 % (ref 0.0–0.2)

## 2024-04-27 LAB — URINALYSIS, W/ REFLEX TO CULTURE (INFECTION SUSPECTED)
RBC / HPF: >50 RBC/hpf (ref 0–5)
Squamous Epithelial / HPF: NONE SEEN /HPF (ref 0–5)
WBC, UA: NONE SEEN WBC/hpf (ref 0–5)

## 2024-04-27 LAB — COMPREHENSIVE METABOLIC PANEL WITH GFR
ALT: 19 U/L (ref 0–44)
AST: 25 U/L (ref 15–41)
Albumin: 4.1 g/dL (ref 3.5–5.0)
Alkaline Phosphatase: 63 U/L (ref 38–126)
Anion gap: 10 (ref 5–15)
BUN: 17 mg/dL (ref 8–23)
CO2: 25 mmol/L (ref 22–32)
Calcium: 9 mg/dL (ref 8.9–10.3)
Chloride: 104 mmol/L (ref 98–111)
Creatinine, Ser: 1.01 mg/dL (ref 0.61–1.24)
GFR, Estimated: >60 mL/min
Glucose, Bld: 118 mg/dL — ABNORMAL HIGH (ref 70–99)
Potassium: 4 mmol/L (ref 3.5–5.1)
Sodium: 140 mmol/L (ref 135–145)
Total Bilirubin: 0.5 mg/dL (ref 0.0–1.2)
Total Protein: 7.1 g/dL (ref 6.5–8.1)

## 2024-04-27 MED ORDER — SODIUM CHLORIDE 0.9 % IV BOLUS
1000.0000 mL | Freq: Once | INTRAVENOUS | Status: AC
  Administered 2024-04-27: 1000 mL via INTRAVENOUS

## 2024-04-27 NOTE — ED Notes (Signed)
Bladder scan showed 36 ml.

## 2024-04-27 NOTE — Discharge Instructions (Signed)
 As we discussed, your blood in the urine likely from the recent procedure  I recommend you stay hydrated  Please call the urology office tomorrow for appointment in a week  Return to ER if you have trouble urinating or more clots in the urine or unable to urinate

## 2024-04-27 NOTE — ED Triage Notes (Signed)
 Pt s/p TURBT on 12/22; reports hematuria this morning; denies pain

## 2024-04-27 NOTE — ED Provider Notes (Signed)
 " [facility] Provider Note   CSN: [id number] Arrival date & time: 04/27/24  1425     Patient presents with: Hematuria   George Jacobs is a 73 y.o. male history of bladder cancer status post TURBT a week ago here presenting with hematuria.  Patient states that after the procedure on the 22nd, he had dark urine.  He states that since yesterday the urine became darker and there is some clots.  He denies any trouble urinating was able to urinate this morning.  He is not currently on any blood thinners.   The history is provided by the patient.       Prior to Admission medications  Medication Sig Start Date End Date Taking? Authorizing Provider  Ascorbic Acid (VITAMIN C) 1000 MG tablet Take 1,000 mg by mouth daily.    [provider]  Cholecalciferol (VITAMIN D3) 50 MCG (2000 UT) TABS Take 2,000 Units by mouth daily.    [provider]  docusate sodium  (COLACE) 100 MG capsule Take 1 capsule (100 mg total) by mouth daily as needed for up to 30 doses. Patient not taking: Reported on 04/11/2024 06/06/21   Selma Donnice SAUNDERS, MD  Multiple Vitamin (MULTIVITAMIN ADULT PO) Take 1 tablet by mouth daily.    [provider]  oxyCODONE -acetaminophen  (PERCOCET) 5-325 MG tablet Take 1 tablet by mouth every 4 (four) hours as needed for up to 18 doses for severe pain (pain score 7-10). 04/21/24   Selma Donnice SAUNDERS, MD  Zinc 100 MG TABS Take 100 mg by mouth daily.    [provider]    Allergies: Diclofenac sodium    Review of Systems  Genitourinary:  Positive for hematuria.  All other systems reviewed and are negative.   Updated Vital Signs BP (!) 161/92 (BP Location: Right Arm)   Pulse 66   Temp 98.3 F (36.8 C)   Resp 18   Ht 6' 3 (1.905 m)   Wt 91.6 kg   SpO2 100%   BMI 25.25 kg/m   Physical Exam Vitals and nursing note reviewed.  HENT:     Head: Normocephalic.     Nose: Nose normal.     Mouth/Throat:      Mouth: Mucous membranes are moist.  Eyes:     Extraocular Movements: Extraocular movements intact.     Pupils: Pupils are equal, round, and reactive to light.  Cardiovascular:     Rate and Rhythm: Normal rate and regular rhythm.     Pulses: Normal pulses.     Heart sounds: Normal heart sounds.  Pulmonary:     Effort: Pulmonary effort is normal.     Breath sounds: Normal breath sounds.  Abdominal:     General: Abdomen is flat.     Palpations: Abdomen is soft.     Comments: No obvious distended bladder  Musculoskeletal:        General: Normal range of motion.     Cervical back: Normal range of motion.  Skin:    General: Skin is warm.     Capillary Refill: Capillary refill takes less than 2 seconds.  Neurological:     General: No focal deficit present.     Mental Status: He is alert and oriented to person, place, and time.  Psychiatric:        Mood and Affect: Mood normal.        Behavior: Behavior normal.     (all labs ordered are listed, but  only abnormal results are displayed) Labs Reviewed  URINALYSIS, W/ REFLEX TO CULTURE (INFECTION SUSPECTED)  CBC WITH DIFFERENTIAL/PLATELET  COMPREHENSIVE METABOLIC PANEL WITH GFR    EKG: None  Radiology: No results found.   Procedures   Medications Ordered in the ED  sodium chloride  0.9 % bolus 1,000 mL (has no administration in time range)                                    Medical Decision Making Arn Mcomber is a 73 y.o. male history of bladder cancer status post TURBT here presenting with hematuria.  Patient just had the removal of the cancer with cystoscopy and likely has some postop bleeding afterwards.  Will get bladder scan to make sure he is not in retention. Will check CBC CMP and UA to rule out anemia or infection and will hydrate patient and reassess   Now Patient's bladder scan showed about 30 cc in his bladder.  UA showed blood and no UTI.  Chemistry unremarkable.  Patient given 2 L normal saline bolus and  urine is more clear now.  Since patient is not in retention, we will hold off on Foley catheter.  I told him to stay hydrated and call urology tomorrow for appointment this week.  Told him to return if he has hematuria with clots or difficulty urinating  Problems Addressed: Gross hematuria: acute illness or injury  Amount and/or Complexity of Data Reviewed Labs: ordered.     Final diagnoses:  None    ED Discharge Orders     None          [name], MD 04/27/24 [phone]  "
